# Patient Record
Sex: Female | Born: 1970 | Race: Black or African American | Hispanic: No | Marital: Single | Smoking: Never smoker
Health system: Southern US, Community
[De-identification: ages and names within clinical notes are randomized; demographics above are authoritative.]

## PROBLEM LIST (undated history)

## (undated) DIAGNOSIS — R479 Unspecified speech disturbances: Secondary | ICD-10-CM

## (undated) DIAGNOSIS — J3089 Other allergic rhinitis: Secondary | ICD-10-CM

## (undated) DIAGNOSIS — R519 Headache, unspecified: Secondary | ICD-10-CM

## (undated) DIAGNOSIS — K219 Gastro-esophageal reflux disease without esophagitis: Secondary | ICD-10-CM

## (undated) DIAGNOSIS — F32A Depression, unspecified: Secondary | ICD-10-CM

## (undated) DIAGNOSIS — F329 Major depressive disorder, single episode, unspecified: Secondary | ICD-10-CM

## (undated) DIAGNOSIS — R51 Headache: Secondary | ICD-10-CM

## (undated) DIAGNOSIS — H5711 Ocular pain, right eye: Secondary | ICD-10-CM

## (undated) DIAGNOSIS — T148XXA Other injury of unspecified body region, initial encounter: Secondary | ICD-10-CM

## (undated) DIAGNOSIS — Z9109 Other allergy status, other than to drugs and biological substances: Secondary | ICD-10-CM

## (undated) DIAGNOSIS — Z6841 Body Mass Index (BMI) 40.0 and over, adult: Secondary | ICD-10-CM

## (undated) DIAGNOSIS — F8081 Childhood onset fluency disorder: Secondary | ICD-10-CM

## (undated) DIAGNOSIS — S0990XA Unspecified injury of head, initial encounter: Secondary | ICD-10-CM

## (undated) DIAGNOSIS — M1990 Unspecified osteoarthritis, unspecified site: Secondary | ICD-10-CM

## (undated) DIAGNOSIS — D649 Anemia, unspecified: Secondary | ICD-10-CM

## (undated) DIAGNOSIS — F419 Anxiety disorder, unspecified: Secondary | ICD-10-CM

## (undated) DIAGNOSIS — G43909 Migraine, unspecified, not intractable, without status migrainosus: Secondary | ICD-10-CM

## (undated) DIAGNOSIS — E7800 Pure hypercholesterolemia, unspecified: Secondary | ICD-10-CM

## (undated) DIAGNOSIS — J45909 Unspecified asthma, uncomplicated: Secondary | ICD-10-CM

## (undated) DIAGNOSIS — Z889 Allergy status to unspecified drugs, medicaments and biological substances status: Secondary | ICD-10-CM

## (undated) HISTORY — DX: Pure hypercholesterolemia, unspecified: E78.00

## (undated) HISTORY — PX: OTHER PROCEDURE: U1053

## (undated) HISTORY — DX: Unspecified asthma, uncomplicated: J45.909

## (undated) HISTORY — DX: Gastro-esophageal reflux disease without esophagitis: K21.9

## (undated) HISTORY — DX: Anxiety disorder, unspecified: F41.9

## (undated) HISTORY — DX: Allergy status to unspecified drugs, medicaments and biological substances: Z88.9

## (undated) HISTORY — DX: Migraine, unspecified, not intractable, without status migrainosus: G43.909

## (undated) HISTORY — PX: DENTAL SURGERY: SHX609

## (undated) HISTORY — DX: Childhood onset fluency disorder: F80.81

## (undated) HISTORY — PX: TUBAL LIGATION: SHX77

## (undated) HISTORY — DX: Other allergic rhinitis: J30.89

## (undated) HISTORY — DX: Morbid (severe) obesity due to excess calories: E66.01

## (undated) HISTORY — DX: Body Mass Index (BMI) 40.0 and over, adult: Z684

## (undated) HISTORY — PX: WISDOM TOOTH EXTRACTION: SHX21

---

## 1998-04-03 ENCOUNTER — Emergency Department (HOSPITAL_COMMUNITY): Admission: EM | Admit: 1998-04-03 | Discharge: 1998-04-03 | Payer: Self-pay | Admitting: Emergency Medicine

## 1998-11-11 ENCOUNTER — Emergency Department (HOSPITAL_COMMUNITY): Admission: EM | Admit: 1998-11-11 | Discharge: 1998-11-11 | Payer: Self-pay | Admitting: Emergency Medicine

## 1998-11-11 ENCOUNTER — Encounter: Payer: Self-pay | Admitting: Emergency Medicine

## 1999-09-30 ENCOUNTER — Ambulatory Visit (HOSPITAL_COMMUNITY): Admission: RE | Admit: 1999-09-30 | Discharge: 1999-09-30 | Payer: Self-pay | Admitting: Internal Medicine

## 1999-09-30 ENCOUNTER — Encounter: Payer: Self-pay | Admitting: Internal Medicine

## 1999-12-03 ENCOUNTER — Emergency Department (HOSPITAL_COMMUNITY): Admission: EM | Admit: 1999-12-03 | Discharge: 1999-12-03 | Payer: Self-pay | Admitting: Emergency Medicine

## 2000-01-19 ENCOUNTER — Emergency Department (HOSPITAL_COMMUNITY): Admission: EM | Admit: 2000-01-19 | Discharge: 2000-01-19 | Payer: Self-pay | Admitting: *Deleted

## 2000-01-20 ENCOUNTER — Encounter: Payer: Self-pay | Admitting: Emergency Medicine

## 2000-01-20 ENCOUNTER — Emergency Department (HOSPITAL_COMMUNITY): Admission: EM | Admit: 2000-01-20 | Discharge: 2000-01-20 | Payer: Self-pay | Admitting: *Deleted

## 2000-09-14 ENCOUNTER — Emergency Department (HOSPITAL_COMMUNITY): Admission: EM | Admit: 2000-09-14 | Discharge: 2000-09-14 | Payer: Self-pay | Admitting: Emergency Medicine

## 2003-09-12 ENCOUNTER — Emergency Department (HOSPITAL_COMMUNITY): Admission: AD | Admit: 2003-09-12 | Discharge: 2003-09-12 | Payer: Self-pay | Admitting: Emergency Medicine

## 2003-09-13 ENCOUNTER — Emergency Department (HOSPITAL_COMMUNITY): Admission: EM | Admit: 2003-09-13 | Discharge: 2003-09-13 | Payer: Self-pay | Admitting: Emergency Medicine

## 2003-12-26 ENCOUNTER — Emergency Department (HOSPITAL_COMMUNITY): Admission: EM | Admit: 2003-12-26 | Discharge: 2003-12-26 | Payer: Self-pay | Admitting: Family Medicine

## 2004-01-07 ENCOUNTER — Emergency Department (HOSPITAL_COMMUNITY): Admission: EM | Admit: 2004-01-07 | Discharge: 2004-01-07 | Payer: Self-pay | Admitting: Family Medicine

## 2004-01-17 ENCOUNTER — Emergency Department (HOSPITAL_COMMUNITY): Admission: EM | Admit: 2004-01-17 | Discharge: 2004-01-17 | Payer: Self-pay | Admitting: Emergency Medicine

## 2004-06-03 ENCOUNTER — Emergency Department (HOSPITAL_COMMUNITY): Admission: EM | Admit: 2004-06-03 | Discharge: 2004-06-03 | Payer: Self-pay | Admitting: Family Medicine

## 2005-01-25 ENCOUNTER — Emergency Department (HOSPITAL_COMMUNITY): Admission: EM | Admit: 2005-01-25 | Discharge: 2005-01-25 | Payer: Self-pay | Admitting: Emergency Medicine

## 2005-03-31 ENCOUNTER — Emergency Department (HOSPITAL_COMMUNITY): Admission: EM | Admit: 2005-03-31 | Discharge: 2005-03-31 | Payer: Self-pay | Admitting: Emergency Medicine

## 2006-03-19 ENCOUNTER — Ambulatory Visit (HOSPITAL_COMMUNITY): Admission: RE | Admit: 2006-03-19 | Discharge: 2006-03-19 | Payer: Self-pay | Admitting: Family Medicine

## 2006-05-24 ENCOUNTER — Emergency Department (HOSPITAL_COMMUNITY): Admission: EM | Admit: 2006-05-24 | Discharge: 2006-05-24 | Payer: Self-pay | Admitting: Emergency Medicine

## 2007-04-15 ENCOUNTER — Emergency Department (HOSPITAL_COMMUNITY): Admission: EM | Admit: 2007-04-15 | Discharge: 2007-04-15 | Payer: Self-pay | Admitting: Emergency Medicine

## 2007-12-01 ENCOUNTER — Emergency Department (HOSPITAL_COMMUNITY): Admission: EM | Admit: 2007-12-01 | Discharge: 2007-12-01 | Payer: Self-pay | Admitting: Emergency Medicine

## 2008-03-28 ENCOUNTER — Encounter: Admission: RE | Admit: 2008-03-28 | Discharge: 2008-03-28 | Payer: Self-pay | Admitting: Internal Medicine

## 2008-04-09 ENCOUNTER — Encounter: Admission: RE | Admit: 2008-04-09 | Discharge: 2008-04-09 | Payer: Self-pay | Admitting: Internal Medicine

## 2008-11-06 ENCOUNTER — Ambulatory Visit: Payer: Self-pay | Admitting: Internal Medicine

## 2008-11-06 ENCOUNTER — Ambulatory Visit (HOSPITAL_BASED_OUTPATIENT_CLINIC_OR_DEPARTMENT_OTHER): Admission: RE | Admit: 2008-11-06 | Discharge: 2008-11-06 | Payer: Self-pay | Admitting: Family Medicine

## 2008-11-19 ENCOUNTER — Encounter: Admission: RE | Admit: 2008-11-19 | Discharge: 2008-11-19 | Payer: Self-pay | Admitting: Family Medicine

## 2008-12-02 ENCOUNTER — Emergency Department (HOSPITAL_COMMUNITY): Admission: EM | Admit: 2008-12-02 | Discharge: 2008-12-02 | Payer: Self-pay | Admitting: Family Medicine

## 2009-06-28 IMAGING — CR DG KNEE COMPLETE 4+V*R*
4 series · 4 of 4 positions shown · non-contrast
Comparison: No priors

CLINICAL DATA: Persistent right knee pain

RIGHT KNEE - COMPLETE 4+ VIEW

[view not recorded (1 of 4)]
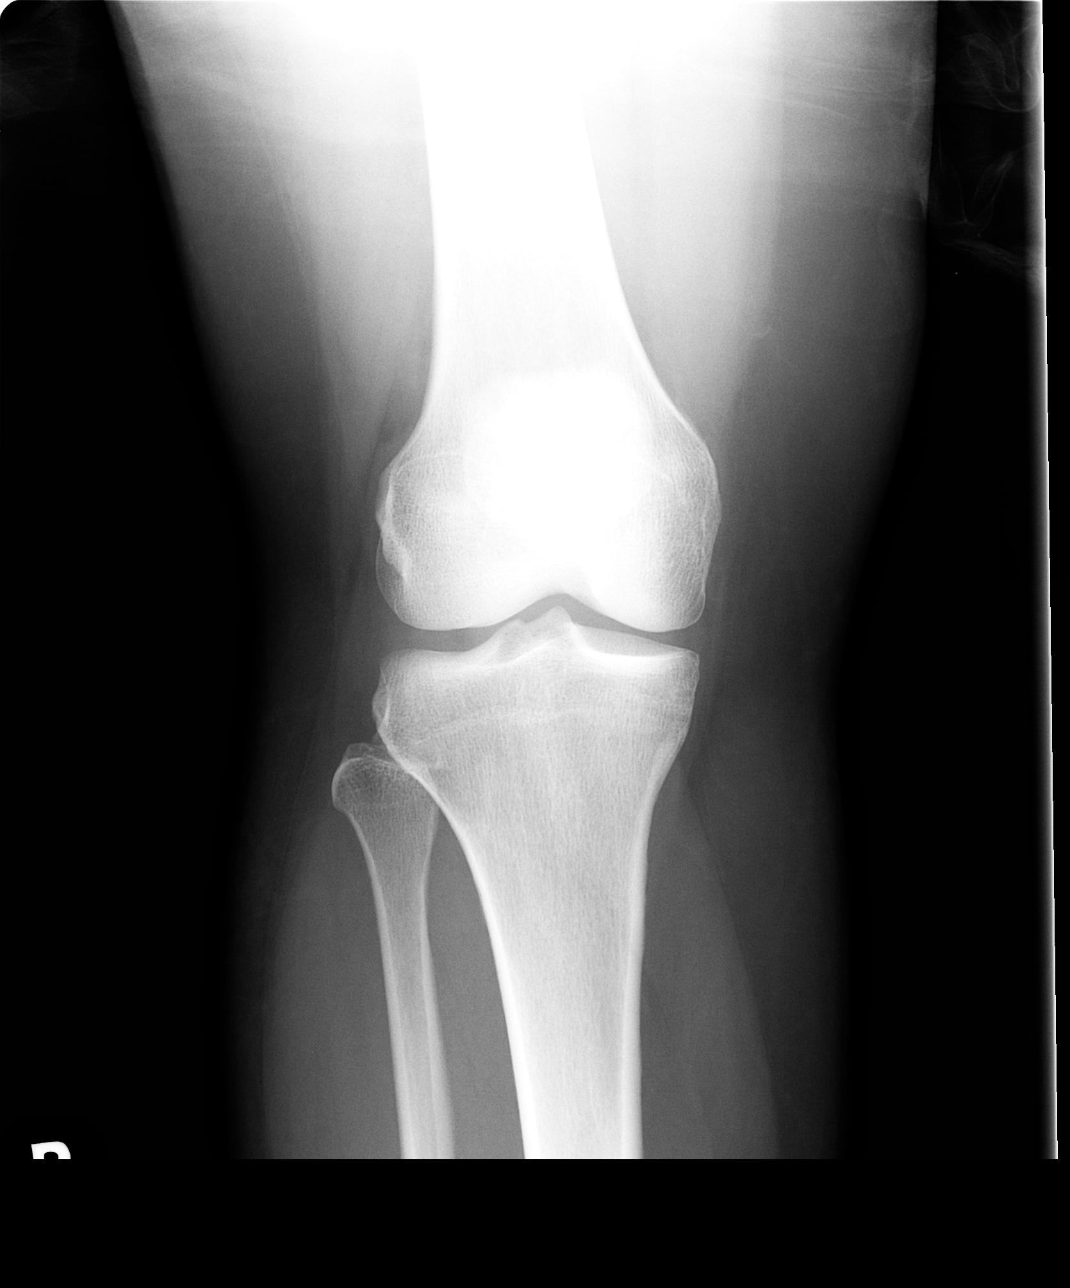

[view not recorded (2 of 4)]
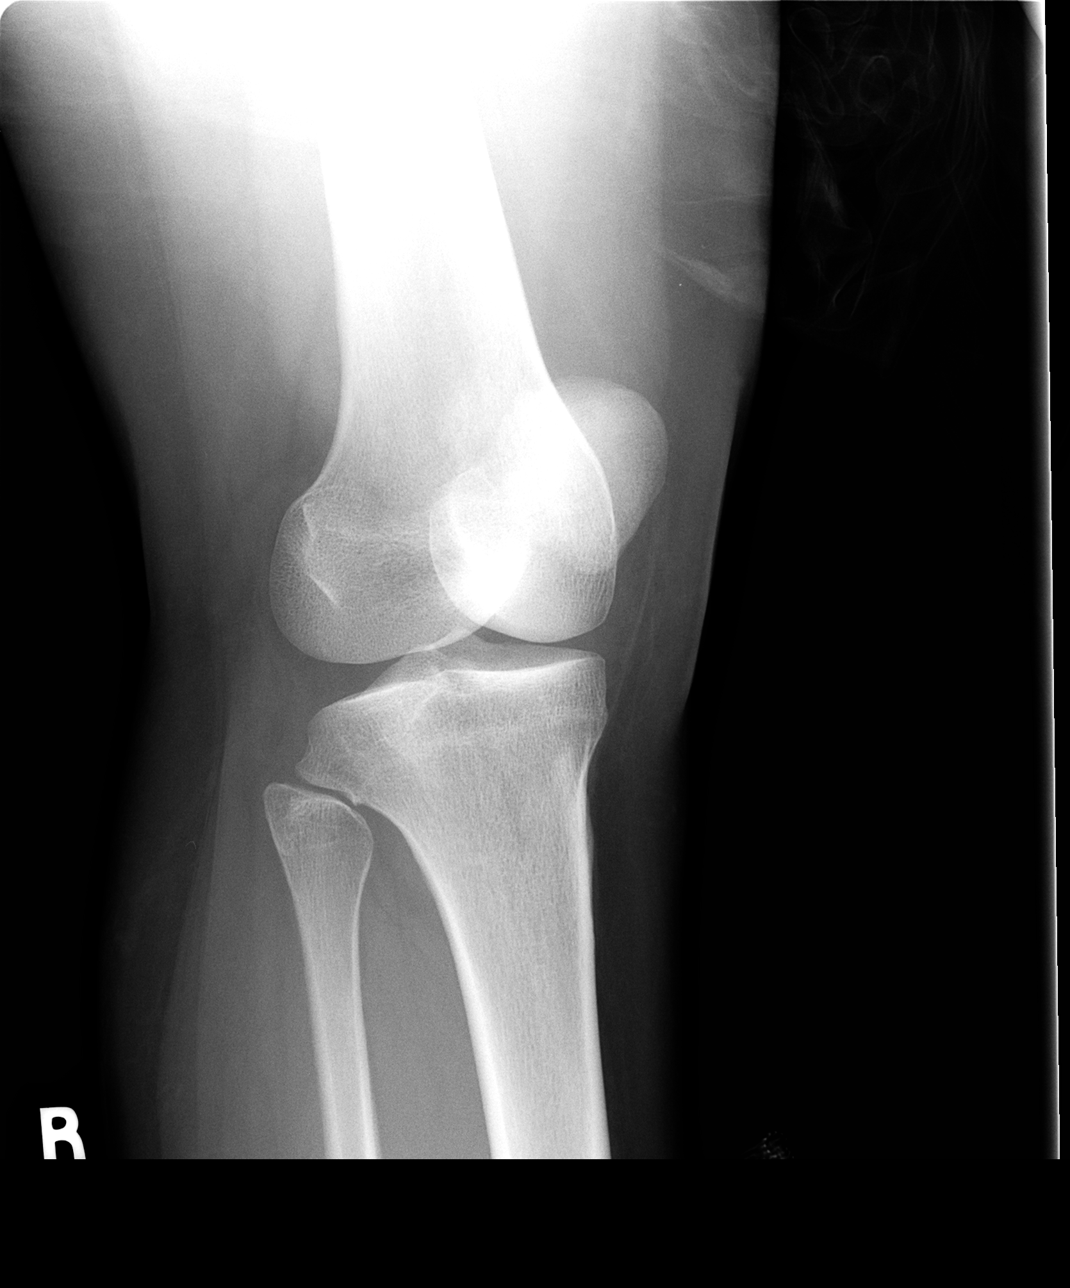

[view not recorded (3 of 4)]
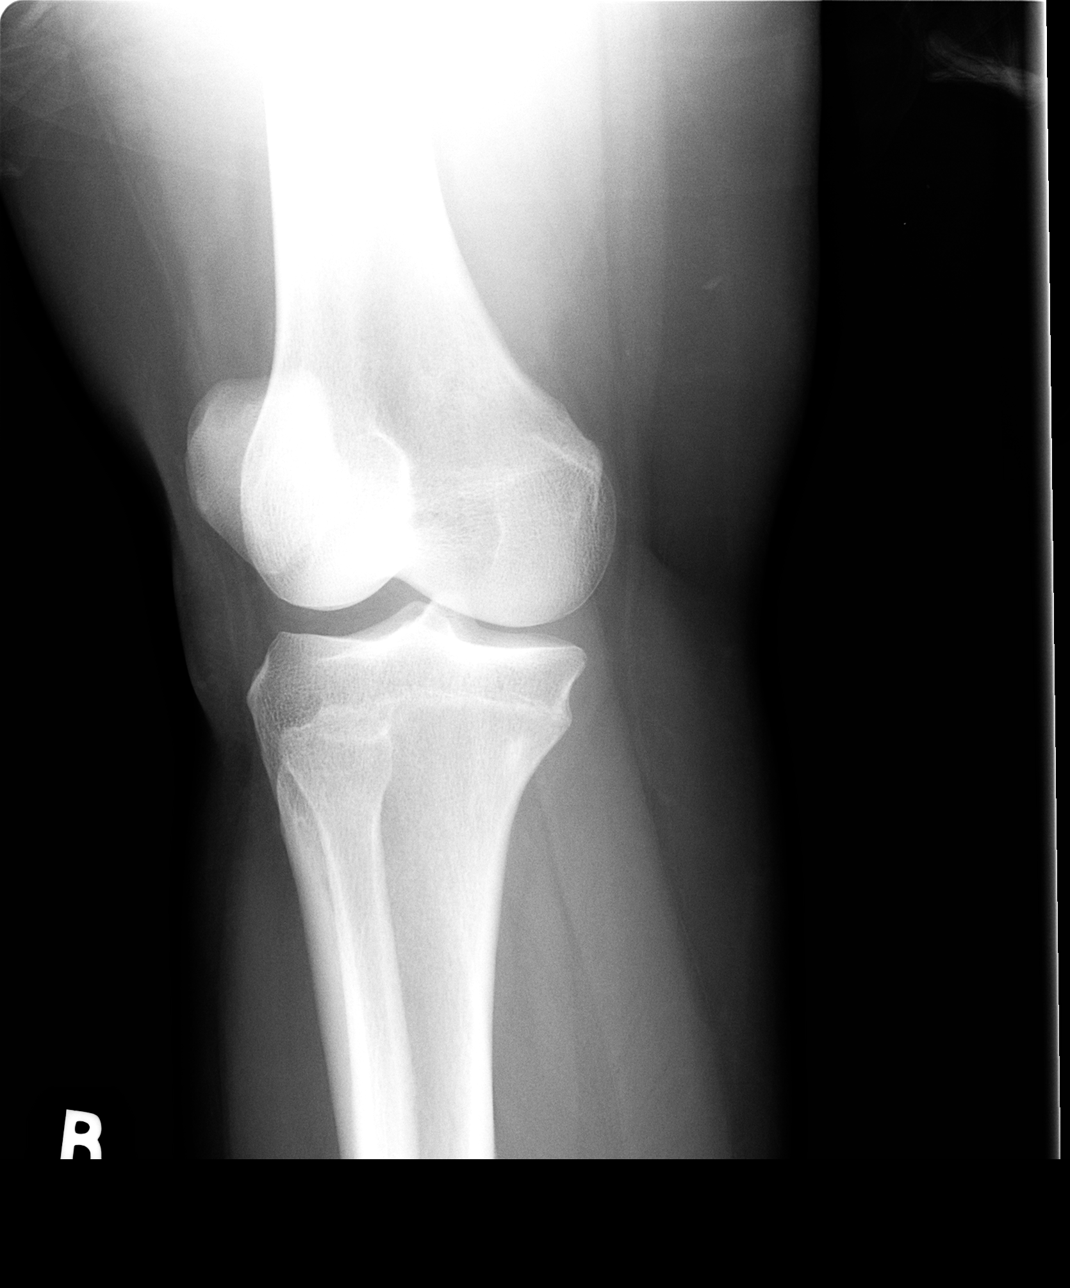

[view not recorded (4 of 4)]
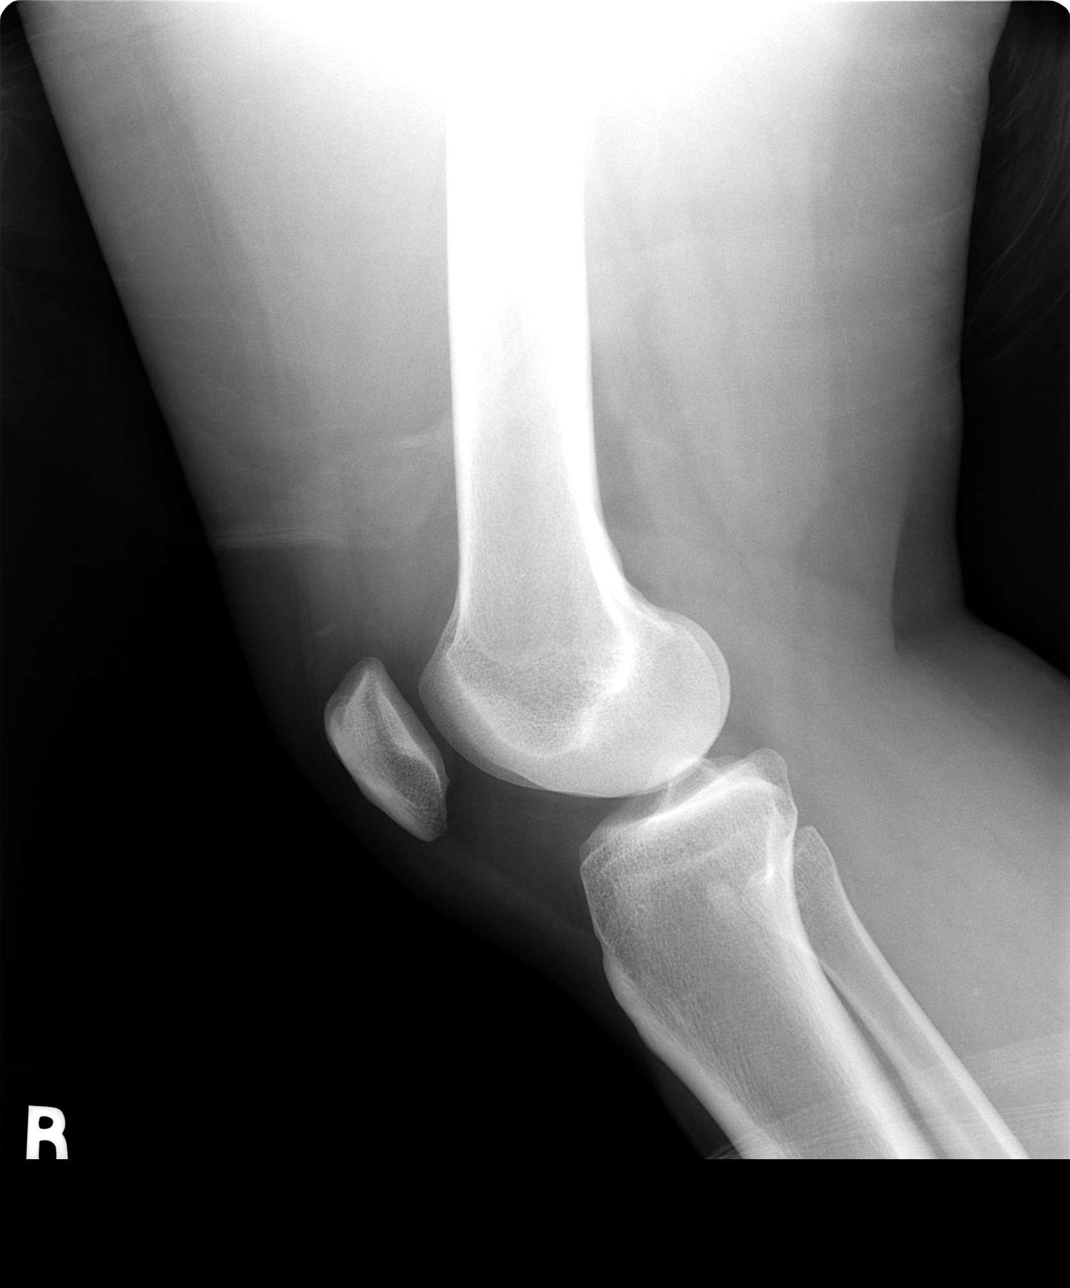

[4 of 4 positions shown; findings below may reference images not displayed]

FINDINGS: No fracture or dislocation.  Cannot rule out joint
effusion on the lateral view.  This is not a definite finding.
Needs clinical correlation.
IMPRESSION: Cannot exclude joint effusion - otherwise unremarkable.

## 2009-08-26 ENCOUNTER — Emergency Department (HOSPITAL_COMMUNITY): Admission: EM | Admit: 2009-08-26 | Discharge: 2009-08-27 | Payer: Self-pay | Admitting: Emergency Medicine

## 2010-11-17 NOTE — Procedures (Signed)
NAME:  Lynn Molina, Lynn Molina NO.:  [id number]   MEDICAL RECORD NO.:  [id number]          PATIENT TYPE:  OUT   LOCATION:  SLEEP CENTER                 FACILITY:  [facility]   PHYSICIAN:  Clinton D. Maple Hudson, MD, FCCP, FACPDATE OF BIRTH:  1972/04/30   DATE OF STUDY:                            NOCTURNAL POLYSOMNOGRAM   REFERRING PHYSICIAN:  Lorelle Formosa, M.D.   INDICATION FOR STUDY:  Hypersomnia with sleep apnea.   EPWORTH SLEEPINESS SCORE:  13/24.  BMI 33.8.  Weight 185 pounds.  Height  62 inches.  Neck 13.5 inches.   MEDICATIONS:  Home medications charted and reviewed.   SLEEP ARCHITECTURE:  Total sleep time 290 minutes with sleep efficiency  71.6%.  Stage I was 7.4%.  Stage II 78.7%.  Stage III absent.  REM 13.9%  of total sleep time.  Sleep latency 49 minutes.  REM latency 73 minutes.  Awake after sleep onset 66 minutes.  Arousal index 17.38.  No bedtime  medication was taken.   RESPIRATORY DATA:  Apnea/hypopnea index (AHI) 4.3 per hour.  A total of  21 events were scored including 6 obstructive apneas and 15 hypopneas.  Events were not positional.  REM AHI 26.7.  An additional 37 respiratory  effort related arousals were counted as events not meeting duration and  oxygen desaturation criteria to be scored as standard apneas or  hypopneas, but still recognized by the technician as causing an EEG  arousal, for a respiratory disturbance index (RDI) of 12 per hour.  There were insufficient events to permit CPAP titration by split  protocol on the study night.   OXYGEN DATA:  Moderately loud snoring with oxygen desaturation to a  nadir of 89%.  Mean oxygen saturation through the study was 95.7% on  room air.   CARDIAC DATA:  Normal sinus rhythm.   MOVEMENT-PARASOMNIA:  No significant movement disturbance.  Bathroom x1.   IMPRESSIONS-RECOMMENDATIONS:  1. Respiratory events with sleep disturbance.  The study was within      normal limits by AHI criteria, 4.3 per  hour (normal range 0-5 per      hour).  By broader RDI criteria 12 per hour, she would be      considered to have mild obstructive sleep apnea/hypopnea syndrome.      Moderately loud snoring with oxygen desaturation to a nadir of 89%.  2. There were insufficient events to permit CPAP titration by split      protocol on the study night.  Consider return for CPAP titration or      evaluate for alternative management as indicated.      Clinton D. Maple Hudson, MD, [facility], FACP  Diplomate, Biomedical engineer of Sleep Medicine  Electronically Signed     CDY/MEDQ  D:  11/11/2008 20:44:24  T:  11/12/2008 10:41:04  Job:  [id number]

## 2011-07-16 ENCOUNTER — Emergency Department (INDEPENDENT_AMBULATORY_CARE_PROVIDER_SITE_OTHER)
Admission: EM | Admit: 2011-07-16 | Discharge: 2011-07-16 | Disposition: A | Discharge status: Home / Self Care | Payer: Medicare Other | Source: Home / Self Care | Attending: Emergency Medicine | Admitting: Emergency Medicine

## 2011-07-16 ENCOUNTER — Encounter (HOSPITAL_COMMUNITY): Payer: Self-pay

## 2011-07-16 DIAGNOSIS — S90859A Superficial foreign body, unspecified foot, initial encounter: Secondary | ICD-10-CM

## 2011-07-16 DIAGNOSIS — IMO0002 Reserved for concepts with insufficient information to code with codable children: Secondary | ICD-10-CM

## 2011-07-16 HISTORY — DX: Unspecified injury of head, initial encounter: S09.90XA

## 2011-07-16 HISTORY — DX: Other injury of unspecified body region, initial encounter: T14.8XXA

## 2011-07-16 NOTE — ED Provider Notes (Signed)
History     CSN: [id number]  Arrival date & time 07/16/11  1447   First MD Initiated Contact with Patient 07/16/11 1603      Chief Complaint  Patient presents with  . Foreign Body    (Consider location/radiation/quality/duration/timing/severity/associated sxs/prior treatment) HPI Comments: Lynn Molina has a small glass shard in the anterior portion of her left foot since this morning. It hurts to walk on it or to putting pressure on the area. There is no surrounding swelling, erythema, or purulent drainage. Her tetanus immunizations are up-to-date.  Patient is a 41 y.o. female presenting with foreign body.  Foreign Body     Past Medical History  Diagnosis Date  . Asthma   . Head injury   . Nerve damage     Past Surgical History  Procedure Date  . Tubal ligation     History reviewed. No pertinent family history.  History  Substance Use Topics  . Smoking status: Never Smoker   . Smokeless tobacco: Not on file  . Alcohol Use: No    OB History    Grav Para Term Preterm Abortions TAB SAB Ect Mult Living                  Review of Systems  Musculoskeletal: Negative for myalgias, back pain, joint swelling, arthralgias and gait problem.  Skin: Negative for rash and wound.  Neurological: Negative for weakness and numbness.    Allergies  Zyrtec  Home Medications   Current Outpatient Rx  Name Route Sig Dispense Refill  . ALBUTEROL IN Inhalation Inhale into the lungs.    Marland Kitchen VICOPROFEN PO Oral Take by mouth.    . ALAVERT PO Oral Take by mouth.    Marland Kitchen LYRICA PO Oral Take by mouth.      BP 126/71  Pulse 86  Temp(Src) 99.2 F (37.3 C) (Oral)  Resp 18  SpO2 99%  LMP 06/22/2011  Physical Exam  Nursing note and vitals reviewed. Constitutional: She is oriented to person, place, and time. She appears well-developed and well-nourished. No distress.  Musculoskeletal: Normal range of motion. She exhibits no edema and no tenderness.       Exam left foot reveals a tiny  glass splinter in the anterior aspect of her foot.  Neurological: She is alert and oriented to person, place, and time. She has normal strength and normal reflexes. She displays no atrophy. No sensory deficit. She exhibits normal muscle tone.  Skin: Skin is warm and dry. No rash noted. She is not diaphoretic.    ED Course  FOREIGN BODY REMOVAL Date/Time: 07/16/2011 6:06 PM Performed by: Roque Lias Authorized by: Roque Lias Consent: Verbal consent obtained. Written consent not obtained. Risks and benefits: risks, benefits and alternatives were discussed Consent given by: patient Patient understanding: patient states understanding of the procedure being performed Patient identity confirmed: verbally with patient and arm band Body area: skin General location: lower extremity Location details: left foot Patient sedated: no Patient restrained: no Patient cooperative: yes Localization method: magnification Removal mechanism: forceps Dressing: dressing applied Tendon involvement: none Depth: subcutaneous Complexity: simple 1 objects recovered. Post-procedure assessment: foreign body removed Patient tolerance: Patient tolerated the procedure well with no immediate complications. Comments: A tiny glass she was identified more by palpation and by visualization. This was then prepped with alcohol and removed with 11 scalpel blade and a forceps. Patient experienced immediate relief of symptoms thereafter. The shard of glass was no more than 1 mm in size.   (  including critical care time)  Labs Reviewed - No data to display No results found.   1. Foreign body in foot       MDM          Roque Lias, MD 07/16/11 [phone]

## 2011-07-16 NOTE — ED Notes (Signed)
Pt states she has sliver of glass stuck in bottom of lt foot- states she stepped on something in her house around 7:30 am today.  States she removed part of it.

## 2013-05-15 ENCOUNTER — Encounter (INDEPENDENT_AMBULATORY_CARE_PROVIDER_SITE_OTHER): Payer: Self-pay

## 2013-05-15 ENCOUNTER — Encounter: Payer: Self-pay | Admitting: Diagnostic Neuroimaging

## 2013-05-15 ENCOUNTER — Ambulatory Visit (INDEPENDENT_AMBULATORY_CARE_PROVIDER_SITE_OTHER): Payer: Medicare HMO | Admitting: Diagnostic Neuroimaging

## 2013-05-15 VITALS — BP 110/78 | HR 67 | Temp 98.9°F | Ht 62.5 in | Wt 194.0 lb

## 2013-05-15 DIAGNOSIS — S0990XS Unspecified injury of head, sequela: Secondary | ICD-10-CM | POA: Insufficient documentation

## 2013-05-15 DIAGNOSIS — G44309 Post-traumatic headache, unspecified, not intractable: Secondary | ICD-10-CM | POA: Insufficient documentation

## 2013-05-15 NOTE — Progress Notes (Signed)
[facility]  PATIENT: Lynn Molina DOB: [birth year]  REFERRING CLINICIAN: A Morrow HISTORY FROM: patient REASON FOR VISIT: new consult   HISTORICAL  CHIEF COMPLAINT:  Chief Complaint  Patient presents with  . 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  5 years ago patient was volunteering at [employer], when she was struck in the head by a hammer which fell off the roof. She immediately felt dazed, wheezy and dizzy. She went to the hospital was evaluated. Patient was discharged home with pain medication.  Versus a time she's had memory problems, word finding difficulty, stuttering. She's also been through pain management.  Currently patient has pressure and intermittent stabbing pain on her right parietal and vertex region. She has some sensitivity to light, burning sensation in her eye on the right side. No nausea vomiting.   Patient's pain is well-controlled with Lyrica 150 mg daily. Previously was on hydrocodone/ibuprofen, but she is not requiring much nowadays.    REVIEW OF SYSTEMS: Full 14 system review of systems performed and notable only for fatigue eye pain shortness of breath snoring anemia allergy moles memory loss headache numbness weakness nor and depression anxiety decreased energy racing thoughts.  ALLERGIES: Allergies  Allergen Reactions  . Zyrtec [Cetirizine Hcl]     HOME MEDICATIONS: Outpatient Prescriptions Prior to Visit  Medication Sig Dispense Refill  . ALBUTEROL IN Inhale into the lungs as needed.       . Loratadine (ALAVERT PO) Take 1 tablet by mouth daily.       . Pregabalin (LYRICA PO) Take 1 tablet by mouth 3 (three) times daily. 150mg       . Hydrocodone-Ibuprofen (VICOPROFEN PO) Take by mouth. 1/2 tab tid       No facility-administered  medications prior to visit.    PAST MEDICAL HISTORY: Past Medical History  Diagnosis Date  . Asthma   . Head injury   . Nerve damage     PAST SURGICAL HISTORY: Past Surgical History  Procedure Laterality Date  . Tubal ligation      FAMILY HISTORY: Family History  Problem Relation Age of Onset  . Breast cancer Mother   . Heart Problems Father   . Leukemia Brother   . Heart Problems Daughter   . Prostate cancer Paternal Grandfather   . Cancer Brother   . Diabetes Paternal Grandmother   . Diabetes Maternal Grandmother   . High blood pressure Maternal Grandmother   . High blood pressure Paternal Grandmother     SOCIAL HISTORY:  History   Social History  . Marital Status: Single    Spouse Name: N/A    Number of Children: 2  . Years of Education: Cosmetolog   Occupational History  .  Other    n/a   Social History Main Topics  . Smoking status: Never Smoker   . Smokeless tobacco: Never Used  . Alcohol Use: Yes     Comment: twice a year  . Drug Use: No  . Sexual Activity: Not on file   Other Topics Concern  . Not on file   Social History Narrative   Patient lives at home with daughter.   Caffeine Use: once or twice weekly     PHYSICAL EXAM  Filed Vitals:   05/15/13 1055  BP:  110/78  Pulse: 67  Temp: 98.9 F (37.2 C)  TempSrc: Oral  Height: 5' 2.5" (1.588 m)  Weight: 194 lb (87.998 kg)    Not recorded    Body mass index is 34.9 kg/(m^2).  GENERAL EXAM: Patient is in no distress  CARDIOVASCULAR: Regular rate and rhythm, no murmurs, no carotid bruits  NEUROLOGIC: MENTAL STATUS: awake, alert, language fluent, comprehension intact, naming intact CRANIAL NERVE: no papilledema on fundoscopic exam, pupils equal and reactive to light, visual fields full to confrontation, extraocular muscles intact, no nystagmus, facial sensation and strength symmetric, uvula midline, shoulder shrug symmetric, tongue midline. MOTOR: normal bulk and tone, full  strength in the BUE, BLE SENSORY: normal and symmetric to light touch, temperature, vibration COORDINATION: finger-nose-finger, fine finger movements normal REFLEXES: deep tendon reflexes present and symmetric GAIT/STATION: narrow based gait  DIAGNOSTIC DATA (LABS, IMAGING, TESTING) - I reviewed patient records, labs, notes, testing and imaging myself where available.  No results found for this basename: WBC, HGB, HCT, MCV, PLT   No results found for this basename: na, k, cl, co2, glucose, bun, creatinine, calcium, prot, albumin, ast, alt, alkphos, bilitot, gfrnonaa, gfraa   No results found for this basename: CHOL, HDL, LDLCALC, LDLDIRECT, TRIG, CHOLHDL   No results found for this basename: HGBA1C   No results found for this basename: VITAMINB12   No results found for this basename: TSH      ASSESSMENT AND PLAN  42 y.o. year old female here with history of head trauma (hit by a hammer falling off a roof) in 2009, with chronic pain as a result.  Dx: post-traumatic headaches  PLAN: - agree with pain mgmt referral - consider continuing lyrica but tapering off narcotics; use NSAIDs for breakthrough pain   Return if symptoms worsen or fail to improve, for return to PCP.    Suanne Marker, MD 05/15/2013, 11:48 AM Certified in Neurology, Neurophysiology and Neuroimaging  [facility] [address] [place], [place] [address] [phone]

## 2013-05-15 NOTE — Patient Instructions (Signed)
Continue current medications. 

## 2014-01-25 ENCOUNTER — Encounter (HOSPITAL_COMMUNITY): Payer: Self-pay | Admitting: Emergency Medicine

## 2014-01-25 ENCOUNTER — Emergency Department (HOSPITAL_COMMUNITY)
Admission: EM | Admit: 2014-01-25 | Discharge: 2014-01-25 | Disposition: A | Discharge status: Home / Self Care | Payer: Medicare HMO | Attending: Emergency Medicine | Admitting: Emergency Medicine

## 2014-01-25 DIAGNOSIS — H579 Unspecified disorder of eye and adnexa: Secondary | ICD-10-CM | POA: Diagnosis not present

## 2014-01-25 DIAGNOSIS — H5710 Ocular pain, unspecified eye: Secondary | ICD-10-CM | POA: Diagnosis present

## 2014-01-25 DIAGNOSIS — Z79899 Other long term (current) drug therapy: Secondary | ICD-10-CM | POA: Insufficient documentation

## 2014-01-25 DIAGNOSIS — J45909 Unspecified asthma, uncomplicated: Secondary | ICD-10-CM | POA: Diagnosis not present

## 2014-01-25 DIAGNOSIS — H18891 Other specified disorders of cornea, right eye: Secondary | ICD-10-CM

## 2014-01-25 DIAGNOSIS — Z87828 Personal history of other (healed) physical injury and trauma: Secondary | ICD-10-CM | POA: Insufficient documentation

## 2014-01-25 MED ORDER — FLUORESCEIN SODIUM 1 MG OP STRP
1.0000 | ORAL_STRIP | Freq: Once | OPHTHALMIC | Status: DC
  Filled 2014-01-25: qty 1

## 2014-01-25 MED ORDER — TETRACAINE HCL 0.5 % OP SOLN
2.0000 [drp] | Freq: Once | OPHTHALMIC | Status: DC
  Filled 2014-01-25: qty 2

## 2014-01-25 NOTE — ED Provider Notes (Signed)
CSN: [id number]     Arrival date & time 01/25/14  0250 History   First MD Initiated Contact with Patient 01/25/14 [phone]     Chief Complaint  Patient presents with  . Eye Problem     (Consider location/radiation/quality/duration/timing/severity/associated sxs/prior Treatment) HPI  Patient presents to the ED with complaints of right eye pain. She reports dreaming about talking to a doctor about her mediations when in her dream her eye started to burn. She woke up and reports her eye was burning like if a jalapeno were in it. She reports going to the bathroom using eye drops and rinsing out her eye twice and not having significant relief, therefore she came to the ED. While waiting in the ED the burning resolved. She reports gardening and cooking with her jalapeno's but felt that she had washed her hands. While talking to her symptoms she realized that she had touched some things by her bed after touching Jalapeno's and before washing her hand. She has no history of glaucoma and says her eye is no long er red either. Denies any other associated symptoms. No associated vision changes or headache.  Past Medical History  Diagnosis Date  . Asthma   . Head injury   . Nerve damage    Past Surgical History  Procedure Laterality Date  . Tubal ligation     Family History  Problem Relation Age of Onset  . Breast cancer Mother   . Heart Problems Father   . Leukemia Brother   . Heart Problems Daughter   . Prostate cancer Paternal Grandfather   . Cancer Brother   . Diabetes Paternal Grandmother   . Diabetes Maternal Grandmother   . High blood pressure Maternal Grandmother   . High blood pressure Paternal Grandmother    History  Substance Use Topics  . Smoking status: Never Smoker   . Smokeless tobacco: Never Used  . Alcohol Use: Yes     Comment: twice a year   OB History   Grav Para Term Preterm Abortions TAB SAB Ect Mult Living                 Review of Systems   Review of Systems   Gen: no weight loss, fevers, chills, night sweats  Eyes: no discharge or drainage, visual changes  + occular pain  Nose: no epistaxis or rhinorrhea  Mouth: no dental pain, no sore throat  Neck: no neck pain  Lungs:No wheezing, coughing or hemoptysis CV: no chest pain, palpitations, dependent edema or orthopnea  Abd: no abdominal pain, nausea, vomiting, diarrhea GU: no dysuria or gross hematuria  MSK:  No muscle weakness or pain Neuro: no headache, no focal neurologic deficits  Skin: no rash or wounds Psyche: no complaints    Allergies  Zyrtec  Home Medications   Prior to Admission medications   Medication Sig Start Date End Date Taking? Authorizing Provider  albuterol (PROVENTIL HFA;VENTOLIN HFA) 108 (90 BASE) MCG/ACT inhaler Inhale 2 puffs into the lungs every 6 (six) hours as needed for wheezing or shortness of breath.   Yes Historical Provider, MD  HYDROcodone-ibuprofen (VICOPROFEN) 7.5-200 MG per tablet Take 1 tablet by mouth every 8 (eight) hours as needed for moderate pain.   Yes Historical Provider, MD  Loratadine (ALAVERT PO) Take 1 tablet by mouth daily.    Yes Historical Provider, MD  pregabalin (LYRICA) 150 MG capsule Take 150 mg by mouth 3 (three) times daily.   Yes Historical Provider, MD  retapamulin (ALTABAX)  1 % ointment Apply 1 application topically 2 (two) times daily as needed (rash).    Yes Historical Provider, MD   BP 110/75  Pulse 90  Temp(Src) 98.8 F (37.1 C) (Oral)  Resp 16  Ht 5\' 2"  (1.575 m)  Wt 197 lb (89.359 kg)  BMI 36.02 kg/m2  SpO2 99%  LMP 01/12/2014 Physical Exam  Nursing note and vitals reviewed. Constitutional: She appears well-developed and well-nourished. No distress.  HENT:  Head: Normocephalic and atraumatic.  Eyes: Conjunctivae, EOM and lids are normal. Pupils are equal, round, and reactive to light. Lids are everted and swept, no foreign bodies found.  Slit lamp exam:      The right eye shows no corneal abrasion, no foreign  body, no fluorescein uptake and no anterior chamber bulge.  Neck: Normal range of motion. Neck supple.  Cardiovascular: Normal rate and regular rhythm.   Pulmonary/Chest: Effort normal.  Abdominal: Soft.  Neurological: She is alert.  Skin: Skin is warm and dry.    ED Course  Procedures (including critical care time) Labs Review Labs Reviewed - No data to display  Imaging Review No results found.   EKG Interpretation None      MDM   Final diagnoses:  Corneal irritation of right eye    Visual Acuity Visual Acuity - Bilateral Near: 20/50 ; R Near: 20/ 100 ; L Near: 20/50  Pt is no longer having symptoms of redness or pain. No more burning. She irrigated the eye herself at home. Reports that now she remembers working with Jalapeno's throughout the day and touching something near her bed before washing her hands. I feel that this is most likely the cause of her symptoms, she may have rubbed her eye while sleeping. I discussed with her that if this reoccurs she would need to be re-evaluated.  43 y.o.Lynn Molina's evaluation in the Emergency Department is complete. It has been determined that no acute conditions requiring further emergency intervention are present at this time. The patient/guardian have been advised of the diagnosis and plan. We have discussed signs and symptoms that warrant return to the ED, such as changes or worsening in symptoms.  Vital signs are stable at discharge. Filed Vitals:   01/25/14 0555  BP: 110/75  Pulse: 90  Temp:   Resp: 16    Patient/guardian has voiced understanding and agreed to follow-up with the PCP or specialist.    Linus Mako, PA-C 01/25/14 0715

## 2014-01-25 NOTE — ED Notes (Signed)
Pt. woke up this morning with right eye redness / pain , denies injury , no loss of vision .

## 2014-01-25 NOTE — ED Provider Notes (Signed)
Medical screening examination/treatment/procedure(s) were performed by non-physician practitioner and as supervising physician I was immediately available for consultation/collaboration.   EKG Interpretation None       Varney Biles, MD 01/25/14 [phone]

## 2014-01-25 NOTE — Discharge Instructions (Signed)
Eye Drops Use eye drops as directed. It may be easier to have someone help you put the drops in your eye. If you are alone, use the following instructions to help you.  Wash your hands before putting drops in your eyes.  Read the label and look at your medication. Check for any expiration date that may appear on the bottle or tube. Changes of color may be a warning that the medication is old or ineffective. This is especially true if the medication has become brown in color. If you have questions or concerns, call your caregiver. DROPS  Tilt your head back with the affected eye uppermost. Gently pull down on your lower lid. Do not pull up on the upper lid.  Look up. Place the dropper or bottle just over the edge of the lower lid near the white portion at the bottom of the eye. The goal is to have the drop go into the little sac formed by the lower lid and the bottom of the eye itself. Do not release the drop from a height of several inches over the eye. That will only serve to startle the person receiving the medicine when it lands and forces a blink.  Steady your hand in a comfortable manner. An example would be to hold the dropper or bottle between your thumb and index (pointing) finger. Lean your index finger against the brow.  Then, slowly and gently squeeze one drop of medication into your eye.  Once the medication has been applied, place your finger between the lower eyelid and the nose, pressing firmly against the nose for 5-10 seconds. This will slow the process of the eye drop entering the small canal that normally drains tears into the nose, and therefore increases the exposure of the medicine to the eye for a few extra seconds. OINTMENTS  Look up. Place the tip of the tube just over the edge of the lower lid near the white portion at the bottom of the eye. The goal is to create a line of ointment along the inner surface of the eyelid in the little sac formed by the lower lid and the  bottom of the eye itself.  Avoid touching the tube tip to your eyeball or eyelid. This avoids contamination of the tube or the medicine in the tube.  Once a line of medicine has been created, hold the upper lid up and look down before releasing the upper lid. This will force the ointment to spread over the surface of the eye.  Your vision will be very blurry for a few minutes after applying an ointment properly. This is normal and will clear as you continue to blink. For this reason, it is best to apply ointments just before going to sleep, or at a time when you can rest your eyes for 5-10 minutes after applying the medication. GENERAL  Store your medicine in a cool, dry place after each use.  If you need a second medication, wait at least two minutes. This helps the first medication to be taken up (absorbed) by the eye.  If you have been instructed to use both an eye drop and an eye ointment, always apply the drop first and then the ointment 3-4 minutes afterward. Never put medications into the eye unless the label reads, "For Ophthalmic Use," "For Use In Eyes" or "Eye Drops." If you have questions, call your caregiver. Document Released: 09/27/2000 Document Revised: 11/05/2013 Document Reviewed: 12/03/2008 ExitCare Patient Information 2015 [employer]. This   information is not intended to replace advice given to you by your health care provider. Make sure you discuss any questions you have with your health care provider. ° °

## 2016-01-12 ENCOUNTER — Encounter (HOSPITAL_COMMUNITY): Payer: Self-pay | Admitting: *Deleted

## 2016-01-13 ENCOUNTER — Other Ambulatory Visit: Payer: Self-pay | Admitting: Obstetrics

## 2016-01-20 MED ORDER — DOXYCYCLINE HYCLATE 100 MG IV SOLR
100.0000 mg | Freq: Two times a day (BID) | INTRAVENOUS | Status: DC
  Filled 2016-01-20 (×4): qty 100

## 2016-01-20 MED ORDER — DOXYCYCLINE HYCLATE 100 MG IV SOLR
100.0000 mg | Freq: Two times a day (BID) | INTRAVENOUS | Status: AC
  Administered 2016-01-21: 100 mg via INTRAVENOUS
  Filled 2016-01-20: qty 100

## 2016-01-21 ENCOUNTER — Encounter (HOSPITAL_COMMUNITY): Payer: Self-pay | Admitting: *Deleted

## 2016-01-21 ENCOUNTER — Ambulatory Visit (HOSPITAL_COMMUNITY): Payer: Medicare HMO | Admitting: Anesthesiology

## 2016-01-21 ENCOUNTER — Ambulatory Visit (HOSPITAL_COMMUNITY)
Admission: RE | Admit: 2016-01-21 | Discharge: 2016-01-21 | Disposition: A | Discharge status: Home / Self Care | Payer: Medicare HMO | Source: Ambulatory Visit | Attending: Obstetrics | Admitting: Obstetrics

## 2016-01-21 ENCOUNTER — Encounter (HOSPITAL_COMMUNITY)
Admission: RE | Disposition: A | Discharge status: Home / Self Care | Payer: Self-pay | Source: Ambulatory Visit | Attending: Obstetrics

## 2016-01-21 DIAGNOSIS — K219 Gastro-esophageal reflux disease without esophagitis: Secondary | ICD-10-CM | POA: Insufficient documentation

## 2016-01-21 DIAGNOSIS — Z6839 Body mass index (BMI) 39.0-39.9, adult: Secondary | ICD-10-CM | POA: Diagnosis not present

## 2016-01-21 DIAGNOSIS — D250 Submucous leiomyoma of uterus: Secondary | ICD-10-CM | POA: Diagnosis not present

## 2016-01-21 DIAGNOSIS — N946 Dysmenorrhea, unspecified: Secondary | ICD-10-CM | POA: Insufficient documentation

## 2016-01-21 DIAGNOSIS — J45909 Unspecified asthma, uncomplicated: Secondary | ICD-10-CM | POA: Insufficient documentation

## 2016-01-21 DIAGNOSIS — N920 Excessive and frequent menstruation with regular cycle: Secondary | ICD-10-CM | POA: Diagnosis present

## 2016-01-21 HISTORY — DX: Anemia, unspecified: D64.9

## 2016-01-21 HISTORY — DX: Unspecified speech disturbances: R47.9

## 2016-01-21 HISTORY — DX: Headache, unspecified: R51.9

## 2016-01-21 HISTORY — DX: Gastro-esophageal reflux disease without esophagitis: K21.9

## 2016-01-21 HISTORY — PX: DILATATION & CURETTAGE/HYSTEROSCOPY WITH MYOSURE: SHX6511

## 2016-01-21 HISTORY — DX: Headache: R51

## 2016-01-21 LAB — CBC
HEMATOCRIT: 28.5 % — AB (ref 36.0–46.0)
HEMOGLOBIN: 8.2 g/dL — AB (ref 12.0–15.0)
MCH: 18.6 pg — ABNORMAL LOW (ref 26.0–34.0)
MCHC: 28.8 g/dL — AB (ref 30.0–36.0)
MCV: 64.5 fL — ABNORMAL LOW (ref 78.0–100.0)
Platelets: 433 10*3/uL — ABNORMAL HIGH (ref 150–400)
RBC: 4.42 MIL/uL (ref 3.87–5.11)
RDW: 19.1 % — AB (ref 11.5–15.5)
WBC: 6.3 10*3/uL (ref 4.0–10.5)

## 2016-01-21 LAB — BASIC METABOLIC PANEL
ANION GAP: 9 (ref 5–15)
BUN: 9 mg/dL (ref 6–20)
CALCIUM: 9.3 mg/dL (ref 8.9–10.3)
CO2: 28 mmol/L (ref 22–32)
Chloride: 102 mmol/L (ref 101–111)
Creatinine, Ser: 0.76 mg/dL (ref 0.44–1.00)
GFR calc Af Amer: >60 mL/min (ref >60)
Glucose, Bld: 86 mg/dL (ref 65–99)
POTASSIUM: 3.9 mmol/L (ref 3.5–5.1)
SODIUM: 139 mmol/L (ref 135–145)

## 2016-01-21 LAB — TYPE AND SCREEN
ABO/RH(D): A POS
ANTIBODY SCREEN: NEGATIVE

## 2016-01-21 LAB — ABO/RH: ABO/RH(D): A POS

## 2016-01-21 LAB — PREGNANCY, URINE: PREG TEST UR: NEGATIVE

## 2016-01-21 SURGERY — DILATATION & CURETTAGE/HYSTEROSCOPY WITH MYOSURE
Anesthesia: General | Site: Vagina

## 2016-01-21 MED ORDER — HYDROCODONE-ACETAMINOPHEN 5-325 MG PO TABS
ORAL_TABLET | ORAL | Status: AC
  Filled 2016-01-21: qty 1

## 2016-01-21 MED ORDER — MIDAZOLAM HCL 2 MG/2ML IJ SOLN
INTRAMUSCULAR | Status: AC
  Filled 2016-01-21: qty 2

## 2016-01-21 MED ORDER — ONDANSETRON HCL 4 MG/2ML IJ SOLN
INTRAMUSCULAR | Status: AC
Start: 2016-01-21 — End: 2016-01-21
  Filled 2016-01-21: qty 2

## 2016-01-21 MED ORDER — VASOPRESSIN 20 UNIT/ML IV SOLN
INTRAVENOUS | Status: DC | PRN
  Administered 2016-01-21: 5 [IU]

## 2016-01-21 MED ORDER — PROPOFOL 10 MG/ML IV BOLUS
INTRAVENOUS | Status: AC
  Filled 2016-01-21: qty 20

## 2016-01-21 MED ORDER — FENTANYL CITRATE (PF) 100 MCG/2ML IJ SOLN
25.0000 ug | INTRAMUSCULAR | Status: DC | PRN
  Administered 2016-01-21: 25 ug via INTRAVENOUS

## 2016-01-21 MED ORDER — BUPIVACAINE HCL 0.5 % IJ SOLN
INTRAMUSCULAR | Status: DC | PRN
  Administered 2016-01-21: 30 mL

## 2016-01-21 MED ORDER — SODIUM CHLORIDE 0.9 % IR SOLN
Status: DC | PRN
  Administered 2016-01-21 (×2): 3000 mL

## 2016-01-21 MED ORDER — FENTANYL CITRATE (PF) 100 MCG/2ML IJ SOLN
INTRAMUSCULAR | Status: DC | PRN
  Administered 2016-01-21: 100 ug via INTRAVENOUS
  Administered 2016-01-21 (×3): 50 ug via INTRAVENOUS

## 2016-01-21 MED ORDER — PROPOFOL 10 MG/ML IV BOLUS
INTRAVENOUS | Status: DC | PRN
  Administered 2016-01-21: 200 mg via INTRAVENOUS

## 2016-01-21 MED ORDER — SCOPOLAMINE 1 MG/3DAYS TD PT72
1.0000 | MEDICATED_PATCH | Freq: Once | TRANSDERMAL | Status: DC
  Administered 2016-01-21: 1.5 mg via TRANSDERMAL

## 2016-01-21 MED ORDER — HYDROCODONE-ACETAMINOPHEN 5-325 MG PO TABS
1.0000 | ORAL_TABLET | Freq: Once | ORAL | Status: AC
  Administered 2016-01-21: 1 via ORAL

## 2016-01-21 MED ORDER — PHENYLEPHRINE HCL 10 MG/ML IJ SOLN
INTRAMUSCULAR | Status: DC | PRN
  Administered 2016-01-21: 80 ug via INTRAVENOUS
  Administered 2016-01-21: 40 ug via INTRAVENOUS
  Administered 2016-01-21: 80 ug via INTRAVENOUS

## 2016-01-21 MED ORDER — KETOROLAC TROMETHAMINE 30 MG/ML IJ SOLN
INTRAMUSCULAR | Status: AC
  Filled 2016-01-21: qty 1

## 2016-01-21 MED ORDER — HYDROCODONE-ACETAMINOPHEN 5-325 MG PO TABS: 1.0000 | ORAL_TABLET | Freq: Four times a day (QID) | ORAL | Status: DC | PRN

## 2016-01-21 MED ORDER — LIDOCAINE HCL (CARDIAC) 20 MG/ML IV SOLN
INTRAVENOUS | Status: DC | PRN
  Administered 2016-01-21: 60 mg via INTRAVENOUS

## 2016-01-21 MED ORDER — SCOPOLAMINE 1 MG/3DAYS TD PT72
MEDICATED_PATCH | TRANSDERMAL | Status: AC
  Administered 2016-01-21: 1.5 mg via TRANSDERMAL
  Filled 2016-01-21: qty 1

## 2016-01-21 MED ORDER — FENTANYL CITRATE (PF) 250 MCG/5ML IJ SOLN
INTRAMUSCULAR | Status: AC
  Filled 2016-01-21: qty 5

## 2016-01-21 MED ORDER — ONDANSETRON HCL 4 MG/2ML IJ SOLN
INTRAMUSCULAR | Status: DC | PRN
  Administered 2016-01-21: 4 mg via INTRAVENOUS

## 2016-01-21 MED ORDER — KETOROLAC TROMETHAMINE 30 MG/ML IJ SOLN
INTRAMUSCULAR | Status: DC | PRN
Start: 2016-01-21 — End: 2016-01-21
  Administered 2016-01-21: 30 mg via INTRAVENOUS

## 2016-01-21 MED ORDER — FENTANYL CITRATE (PF) 100 MCG/2ML IJ SOLN
INTRAMUSCULAR | Status: AC
  Filled 2016-01-21: qty 2

## 2016-01-21 MED ORDER — LIDOCAINE HCL (CARDIAC) 20 MG/ML IV SOLN
INTRAVENOUS | Status: AC
  Filled 2016-01-21: qty 5

## 2016-01-21 MED ORDER — KETOROLAC TROMETHAMINE 30 MG/ML IJ SOLN: 30.0000 mg | Freq: Once | INTRAMUSCULAR | Status: DC

## 2016-01-21 MED ORDER — ONDANSETRON HCL 4 MG/2ML IJ SOLN: 4.0000 mg | Freq: Once | INTRAMUSCULAR | Status: DC | PRN

## 2016-01-21 MED ORDER — MEPERIDINE HCL 25 MG/ML IJ SOLN: 6.2500 mg | INTRAMUSCULAR | Status: DC | PRN

## 2016-01-21 MED ORDER — LACTATED RINGERS IV SOLN
INTRAVENOUS | Status: DC
  Administered 2016-01-21 (×2): via INTRAVENOUS

## 2016-01-21 MED ORDER — MIDAZOLAM HCL 2 MG/2ML IJ SOLN
INTRAMUSCULAR | Status: DC | PRN
  Administered 2016-01-21 (×2): 2 mg via INTRAVENOUS

## 2016-01-21 SURGICAL SUPPLY — 19 items
CATH ROBINSON RED A/P 16FR (CATHETERS) ×3 IMPLANT
CLOTH BEACON ORANGE TIMEOUT ST (SAFETY) ×3 IMPLANT
CONTAINER PREFILL 10% NBF 60ML (FORM) ×3 IMPLANT
DEVICE MYOSURE LITE (MISCELLANEOUS) IMPLANT
DEVICE MYOSURE REACH (MISCELLANEOUS) ×3 IMPLANT
FILTER ARTHROSCOPY CONVERTOR (FILTER) ×3 IMPLANT
GLOVE BIO SURGEON STRL SZ 6.5 (GLOVE) ×4 IMPLANT
GLOVE BIO SURGEONS STRL SZ 6.5 (GLOVE) ×2
GLOVE BIOGEL PI IND STRL 7.0 (GLOVE) ×2 IMPLANT
GLOVE BIOGEL PI INDICATOR 7.0 (GLOVE) ×4
GOWN STRL REUS W/TWL LRG LVL3 (GOWN DISPOSABLE) ×6 IMPLANT
PACK VAGINAL MINOR WOMEN LF (CUSTOM PROCEDURE TRAY) ×3 IMPLANT
PAD OB MATERNITY 4.3X12.25 (PERSONAL CARE ITEMS) ×3 IMPLANT
SEAL ROD LENS SCOPE MYOSURE (ABLATOR) ×3 IMPLANT
STENT BALLN UTERINE 4CM 6FR (STENTS) IMPLANT
TOWEL OR 17X24 6PK STRL BLUE (TOWEL DISPOSABLE) ×6 IMPLANT
TUBING AQUILEX INFLOW (TUBING) ×3 IMPLANT
TUBING AQUILEX OUTFLOW (TUBING) ×3 IMPLANT
WATER STERILE IRR 1000ML POUR (IV SOLUTION) ×3 IMPLANT

## 2016-01-21 NOTE — Anesthesia Procedure Notes (Signed)
Procedure Name: LMA Insertion Date/Time: 01/21/2016 2:51 PM Performed by: Jonna Munro Pre-anesthesia Checklist: Patient identified, Emergency Drugs available, Suction available, Patient being monitored and Timeout performed Patient Re-evaluated:Patient Re-evaluated prior to inductionOxygen Delivery Method: Circle system utilized Preoxygenation: Pre-oxygenation with 100% oxygen Intubation Type: IV induction LMA: LMA inserted LMA Size: 4.0 Number of attempts: 1 Placement Confirmation: positive ETCO2 and breath sounds checked- equal and bilateral Dental Injury: Teeth and Oropharynx as per pre-operative assessment

## 2016-01-21 NOTE — Anesthesia Preprocedure Evaluation (Signed)
Anesthesia Evaluation  Patient identified by MRN, date of birth, ID band Patient awake    Reviewed: Allergy & Precautions, H&P , NPO status , Patient's Chart, lab work & pertinent test results  Airway Mallampati: I  TM Distance: >3 FB Neck ROM: full    Dental no notable dental hx. (+) Teeth Intact   Pulmonary    Pulmonary exam normal        Cardiovascular negative cardio ROS Normal cardiovascular exam     Neuro/Psych negative psych ROS   GI/Hepatic Neg liver ROS,   Endo/Other  Morbid obesity  Renal/GU negative Renal ROS     Musculoskeletal   Abdominal (+) + obese,   Peds  Hematology   Anesthesia Other Findings   Reproductive/Obstetrics negative OB ROS                             Anesthesia Physical Anesthesia Plan  ASA: III  Anesthesia Plan: General   Post-op Pain Management:    Induction: Intravenous  Airway Management Planned: LMA  Additional Equipment:   Intra-op Plan:   Post-operative Plan:   Informed Consent: I have reviewed the patients History and Physical, chart, labs and discussed the procedure including the risks, benefits and alternatives for the proposed anesthesia with the patient or authorized representative who has indicated his/her understanding and acceptance.     Plan Discussed with: CRNA and Surgeon  Anesthesia Plan Comments:         Anesthesia Quick Evaluation

## 2016-01-21 NOTE — H&P (Signed)
CC; menorrhagia, polyp vs fibroid  HPI: 45 yo NP pt with anemia due to menorrhagia and intracavitary mass- unclear whether polyp or fibroid for hysteroscopic resection.  Past Medical History  Diagnosis Date  . Head injury     Hammer fell on head - tx w lyrica  . Nerve damage     Head  . Asthma     rarely uses inhaler  . 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 GERD (gastroesophageal reflux disease)     occasional but diet controlled, no med  . Headache     tx with lyrica  . Anemia     hx  . SVD (spontaneous vaginal delivery)     x 2    Past Surgical History  Procedure Laterality Date  . Tubal ligation    . Dental surgery      tooth ext with general anesthesia  . Wisdom tooth extraction      PE: Filed Vitals:   01/12/16 1533 01/21/16 1340  BP:  135/77  Pulse:  80  Temp:  98.5 F (36.9 C)  TempSrc:  Oral  Resp:  18  Height: 5\' 2"  (1.575 m)   Weight: 96.616 kg (213 lb)   SpO2:  100%   Gen: well appearing, no distress Abd: obese, NT Gu: def to OR LE: SCDs in place  CBC    Component Value Date/Time   WBC 6.3 01/21/2016 1328   RBC 4.42 01/21/2016 1328   HGB 8.2* 01/21/2016 1328   HCT 28.5* 01/21/2016 1328   PLT 433* 01/21/2016 1328   MCV 64.5* 01/21/2016 1328   MCH 18.6* 01/21/2016 1328   MCHC 28.8* 01/21/2016 1328   RDW 19.1* 01/21/2016 1328     A/P: polyp vs fibroid, for resection.  R/B reviewed with pt, pt gives consent for blood if needed.  Alphons Burgert,Tricia A. 01/21/2016 2:29 PM

## 2016-01-21 NOTE — Op Note (Signed)
01/21/2016  3:49 PM  PATIENT:  Lynn Molina  45 y.o. female  PRE-OPERATIVE DIAGNOSIS:  Menorrhagia, Endometrial Polyp, Uterine Fibroids  POST-OPERATIVE DIAGNOSIS:  Menorrhagia, uterine fibroids  PROCEDURE:  Procedure(s) with comments: DILATATION & CURETTAGE/HYSTEROSCOPY WITH MYOSURE (N/Molina) - HOLD MYOSURE ITEMS .Marland Kitchen..POSSIBLE MYOSURE   SURGEON:  Surgeon(s) and Role:    * Aloha Gell, MD - Primary  PHYSICIAN ASSISTANT:   ASSISTANTS: none   ANESTHESIA:   local and general  EBL:  Total I/O In: -  Out: 40 [Urine:30; Blood:10]  BLOOD ADMINISTERED:none  DRAINS: none   LOCAL MEDICATIONS USED:  MARCAINE    and OTHER with 0.3cc vasopressin/ 30 cc 0.5% marcaine, total 20cc used  SPECIMEN:  Source of Specimen:  uterine fibroid  DISPOSITION OF SPECIMEN:  PATHOLOGY  COUNTS:  YES  TOURNIQUET:  * No tourniquets in log *  DICTATION: note in epic  PLAN OF CARE: Discharge to home after PACU  PATIENT DISPOSITION:  PACU - hemodynamically stable.   Delay start of Pharmacological VTE agent (>24hrs) due to surgical blood loss or risk of bleeding: yes   Abx: 100mg  IV doxycycline  Findings:2x2 cm cavitary SM fibroid stemming from L lower segment. Small 72mm fundal cavitary calcified fibroid.   visualization of bilateral ostia- only able to see L ostia after fibroid resection, hemostasis post-procedure  Indications: submucosal fibroid, bleeding, dymenorrhea anemia   After informed consent including discussion of risks of bleeding, infection, perforation,  the patient was taken to the operating room where general anesthesia was initiated without difficulty. She was prepped and draped in normal sterile fashion in the dorsal supine lithotomy position.  Molina bimanual examination was done to assess the size and position of the uterus. Molina speculum was placed in the vagina and single tooth tenaculum used to grasp the anterior lip of the cervix. Local anaesthetic with vasopressin was injected at 5  and 7 o'clock in there cervico-paracervical junction.   The cervix was then serially dilated to Molina #25 Pratt dilator. The obturator was placed into the cervix and past the internal os. The obturator was removed and the [facility] hysteroscopic blade was inserted. Survey of the endometrium/ pathology with findings as above. The myosure blade was then placed through the operating channel, suction was applied and the fibroid was serially grasped with the blade and morcellated. This continued until the majority of the fibroid was removed. The remainder of the myometrium was thin with no masses or thicker tissue. The remainder of the uterus appeared normal. Hemostasis was noted.  The hysteroscope was then removed. Tenaculum was removed. The tenaculum site was hemostatic and the case was terminated. The patient tolerated the procedure well. Sponge, lap and needle counts were correct and the patient was taken to the recovery room in stable condition.   toradol was given  Lynn Molina,Lynn Molina. 01/21/2016 3:53 PM

## 2016-01-21 NOTE — Transfer of Care (Signed)
Immediate Anesthesia Transfer of Care Note  Patient: DESMOND SCHUKNECHT  Procedure(s) Performed: Procedure(s) with comments: DILATATION & CURETTAGE/HYSTEROSCOPY WITH MYOSURE (N/A) - HOLD Bowler .Marland Kitchen..POSSIBLE MYOSURE   Patient Location: PACU  Anesthesia Type:General  Level of Consciousness: awake, alert  and oriented  Airway & Oxygen Therapy: Patient Spontanous Breathing and Patient connected to nasal cannula oxygen  Post-op Assessment: Report given to RN and Post -op Vital signs reviewed and stable  Post vital signs: Reviewed and stable  Last Vitals:  Filed Vitals:   01/21/16 1340  BP: 135/77  Pulse: 80  Temp: 36.9 C  Resp: 18    Last Pain: There were no vitals filed for this visit.    Patients Stated Pain Goal: 3 ([id number])  Complications: No apparent anesthesia complications

## 2016-01-21 NOTE — Brief Op Note (Signed)
01/21/2016  3:49 PM  PATIENT:  Lynn Molina  45 y.o. female  PRE-OPERATIVE DIAGNOSIS:  Menorrhagia, Endometrial Polyp, Uterine Fibroids  POST-OPERATIVE DIAGNOSIS:  Menorrhagia, uterine fibroids  PROCEDURE:  Procedure(s) with comments: DILATATION & CURETTAGE/HYSTEROSCOPY WITH MYOSURE (N/A) - HOLD MYOSURE ITEMS .Marland Kitchen..POSSIBLE MYOSURE   SURGEON:  Surgeon(s) and Role:    * Aloha Gell, MD - Primary  PHYSICIAN ASSISTANT:   ASSISTANTS: none   ANESTHESIA:   local and general  EBL:  Total I/O In: -  Out: 40 [Urine:30; Blood:10]  BLOOD ADMINISTERED:none  DRAINS: none   LOCAL MEDICATIONS USED:  MARCAINE    and OTHER with 0.3cc vasopressin/ 30 cc 0.5% marcaine, total 20cc used  SPECIMEN:  Source of Specimen:  uterine fibroid  DISPOSITION OF SPECIMEN:  PATHOLOGY  COUNTS:  YES  TOURNIQUET:  * No tourniquets in log *  DICTATION: note in epic  PLAN OF CARE: Discharge to home after PACU  PATIENT DISPOSITION:  PACU - hemodynamically stable.   Delay start of Pharmacological VTE agent (>24hrs) due to surgical blood loss or risk of bleeding: yes

## 2016-01-21 NOTE — Discharge Instructions (Signed)
DISCHARGE INSTRUCTIONS: HYSTEROSCOPY The following instructions have been prepared to help you care for yourself upon your return home.  May Remove Scop patch on or before 01/24/16. Wash hands with soap and water after contact with the patch.  May take Ibuprofen after 9:30pm 01/21/16.  May take stool softner while taking narcotic pain medication to prevent constipation.  Drink plenty of water.  Personal hygiene:  Use sanitary pads for vaginal drainage, not tampons.  Shower the day after your procedure.  NO tub baths, pools or Jacuzzis for 2-3 weeks.  Wipe front to back after using the bathroom.  Activity and limitations:  Do NOT drive or operate any equipment for 24 hours. The effects of anesthesia are still present and drowsiness may result.  Do NOT rest in bed all day.  Walking is encouraged.  Walk up and down stairs slowly.  You may resume your normal activity in one to two days or as indicated by your physician. Sexual activity: NO intercourse for at least 2 weeks after the procedure, or as indicated by your Doctor.  Diet: Eat a light meal as desired this evening. You may resume your usual diet tomorrow.  Return to Work: You may resume your work activities in one to two days or as indicated by Marine scientist.  What to expect after your surgery: Expect to have vaginal bleeding/discharge for 2-3 days and spotting for up to 10 days. It is not unusual to have soreness for up to 1-2 weeks. You may have a slight burning sensation when you urinate for the first day. Mild cramps may continue for a couple of days. You may have a regular period in 2-6 weeks.  Call your doctor for any of the following:  Excessive vaginal bleeding or clotting, saturating and changing one pad every hour.  Inability to urinate 6 hours after discharge from hospital.  Pain not relieved by pain medication.  Fever of 100.4 F or greater.  Unusual vaginal discharge or odor.  Return to office  _________________Call for an appointment ___________________ Patients signature: ______________________ Nurses signature ________________________  [place] [facility] [phone]

## 2016-01-21 NOTE — Anesthesia Postprocedure Evaluation (Signed)
Anesthesia Post Note  Patient: Lynn Molina  Procedure(s) Performed: Procedure(s) (LRB): DILATATION & CURETTAGE/HYSTEROSCOPY WITH MYOSURE (N/A)  Patient location during evaluation: PACU Anesthesia Type: General Level of consciousness: awake Pain management: pain level controlled Vital Signs Assessment: post-procedure vital signs reviewed and stable Respiratory status: spontaneous breathing Cardiovascular status: stable Postop Assessment: no signs of nausea or vomiting Anesthetic complications: no     Last Vitals:  Filed Vitals:   01/21/16 1630 01/21/16 1645  BP: 125/68 119/84  Pulse: 83 89  Temp:  36.3 C  Resp: 20 18    Last Pain:  Filed Vitals:   01/21/16 1649  PainSc: 2    Pain Goal: Patients Stated Pain Goal: 3 (01/21/16 1645)               [place]

## 2016-01-26 ENCOUNTER — Encounter (HOSPITAL_COMMUNITY): Payer: Self-pay | Admitting: Obstetrics

## 2016-02-14 ENCOUNTER — Inpatient Hospital Stay (HOSPITAL_COMMUNITY)
Admission: AD | Admit: 2016-02-14 | Discharge: 2016-02-14 | Disposition: A | Discharge status: Home / Self Care | Payer: Medicare HMO | Source: Ambulatory Visit | Attending: Obstetrics & Gynecology | Admitting: Obstetrics & Gynecology

## 2016-02-14 ENCOUNTER — Encounter (HOSPITAL_COMMUNITY): Payer: Self-pay | Admitting: *Deleted

## 2016-02-14 DIAGNOSIS — N946 Dysmenorrhea, unspecified: Secondary | ICD-10-CM | POA: Diagnosis present

## 2016-02-14 LAB — URINE MICROSCOPIC-ADD ON

## 2016-02-14 LAB — URINALYSIS, ROUTINE W REFLEX MICROSCOPIC
Bilirubin Urine: NEGATIVE
GLUCOSE, UA: NEGATIVE mg/dL
Ketones, ur: NEGATIVE mg/dL
LEUKOCYTES UA: NEGATIVE
Nitrite: NEGATIVE
PROTEIN: NEGATIVE mg/dL
SPECIFIC GRAVITY, URINE: 1.025 (ref 1.005–1.030)
pH: 6 (ref 5.0–8.0)

## 2016-02-14 LAB — CBC
HEMATOCRIT: 28.8 % — AB (ref 36.0–46.0)
HEMOGLOBIN: 8.4 g/dL — AB (ref 12.0–15.0)
MCH: 18.8 pg — ABNORMAL LOW (ref 26.0–34.0)
MCHC: 29.2 g/dL — ABNORMAL LOW (ref 30.0–36.0)
MCV: 64.3 fL — ABNORMAL LOW (ref 78.0–100.0)
Platelets: 423 10*3/uL — ABNORMAL HIGH (ref 150–400)
RBC: 4.48 MIL/uL (ref 3.87–5.11)
RDW: 18.6 % — ABNORMAL HIGH (ref 11.5–15.5)
WBC: 6.1 10*3/uL (ref 4.0–10.5)

## 2016-02-14 LAB — POCT PREGNANCY, URINE: PREG TEST UR: NEGATIVE

## 2016-02-14 MED ORDER — NORGESTIMATE-ETH ESTRADIOL 0.25-35 MG-MCG PO TABS: ORAL_TABLET | ORAL | 0 refills | Status: DC

## 2016-02-14 MED ORDER — HYDROCODONE-ACETAMINOPHEN 5-325 MG PO TABS
2.0000 | ORAL_TABLET | Freq: Four times a day (QID) | ORAL | 0 refills | Status: DC | PRN
Start: 2016-02-14 — End: 2017-09-07

## 2016-02-14 MED ORDER — KETOROLAC TROMETHAMINE 60 MG/2ML IM SOLN
60.0000 mg | Freq: Once | INTRAMUSCULAR | Status: AC
  Administered 2016-02-14: 60 mg via INTRAMUSCULAR
  Filled 2016-02-14: qty 2

## 2016-02-14 NOTE — MAU Note (Signed)
Patient had surgery 7/19.  She started feeling better a few weeks after surgery, but the pain started back yesterday 02/13/16 when her menstrual cycle started.  Pt c/o abdominal pain, thigh pain, and back pain.

## 2016-02-14 NOTE — Discharge Instructions (Signed)
Dysmenorrhea °Menstrual cramps (dysmenorrhea) are caused by the muscles of the uterus tightening (contracting) during a menstrual period. For some women, this discomfort is merely bothersome. For others, dysmenorrhea can be severe enough to interfere with everyday activities for a few days each month. °Primary dysmenorrhea is menstrual cramps that last a couple of days when you start having menstrual periods or soon after. This often begins after a teenager starts having her period. As a woman gets older or has a baby, the cramps will usually lessen or disappear. Secondary dysmenorrhea begins later in life, lasts longer, and the pain may be stronger than primary dysmenorrhea. The pain may start before the period and last a few days after the period.  °CAUSES  °Dysmenorrhea is usually caused by an underlying problem, such as: °· The tissue lining the uterus grows outside of the uterus in other areas of the body (endometriosis). °· The endometrial tissue, which normally lines the uterus, is found in or grows into the muscular walls of the uterus (adenomyosis). °· The pelvic blood vessels are engorged with blood just before the menstrual period (pelvic congestive syndrome). °· Overgrowth of cells (polyps) in the lining of the uterus or cervix. °· Falling down of the uterus (prolapse) because of loose or stretched ligaments. °· Depression. °· Bladder problems, infection, or inflammation. °· Problems with the intestine, a tumor, or irritable bowel syndrome. °· Cancer of the female organs or bladder. °· A severely tipped uterus. °· A very tight opening or closed cervix. °· Noncancerous tumors of the uterus (fibroids). °· Pelvic inflammatory disease (PID). °· Pelvic scarring (adhesions) from a previous surgery. °· Ovarian cyst. °· An intrauterine device (IUD) used for birth control. °RISK FACTORS °You may be at greater risk of dysmenorrhea if: °· You are younger than age 30. °· You started puberty early. °· You have  irregular or heavy bleeding. °· You have never given birth. °· You have a family history of this problem. °· You are a smoker. °SIGNS AND SYMPTOMS  °· Cramping or throbbing pain in your lower abdomen. °· Headaches. °· Lower back pain. °· Nausea or vomiting. °· Diarrhea. °· Sweating or dizziness. °· Loose stools. °DIAGNOSIS  °A diagnosis is based on your history, symptoms, physical exam, diagnostic tests, or procedures. Diagnostic tests or procedures may include: °· Blood tests. °· Ultrasonography. °· An examination of the lining of the uterus (dilation and curettage, D&C). °· An examination inside your abdomen or pelvis with a scope (laparoscopy). °· X-rays. °· CT scan. °· MRI. °· An examination inside the bladder with a scope (cystoscopy). °· An examination inside the intestine or stomach with a scope (colonoscopy, gastroscopy). °TREATMENT  °Treatment depends on the cause of the dysmenorrhea. Treatment may include: °· Pain medicine prescribed by your health care provider. °· Birth control pills or an IUD with progesterone hormone in it. °· Hormone replacement therapy. °· Nonsteroidal anti-inflammatory drugs (NSAIDs). These may help stop the production of prostaglandins. °· Surgery to remove adhesions, endometriosis, ovarian cyst, or fibroids. °· Removal of the uterus (hysterectomy). °· Progesterone shots to stop the menstrual period. °· Cutting the nerves on the sacrum that go to the female organs (presacral neurectomy). °· Electric current to the sacral nerves (sacral nerve stimulation). °· Antidepressant medicine. °· Psychiatric therapy, counseling, or group therapy. °· Exercise and physical therapy. °· Meditation and yoga therapy. °· Acupuncture. °HOME CARE INSTRUCTIONS  °· Only take over-the-counter or prescription medicines as directed by your health care provider. °· Place a heating pad   or hot water bottle on your lower back or abdomen. Do not sleep with the heating pad.  Use aerobic exercises, walking,  swimming, biking, and other exercises to help lessen the cramping.  Massage to the lower back or abdomen may help.  Stop smoking.  Avoid alcohol and caffeine. SEEK MEDICAL CARE IF:   Your pain does not get better with medicine.  You have pain with sexual intercourse.  Your pain increases and is not controlled with medicines.  You have abnormal vaginal bleeding with your period.  You develop nausea or vomiting with your period that is not controlled with medicine. SEEK IMMEDIATE MEDICAL CARE IF:  You pass out.    This information is not intended to replace advice given to you by your health care provider. Make sure you discuss any questions you have with your health care provider.   Document Released: 06/21/2005 Document Revised: 02/21/2013 Document Reviewed: 12/07/2012 Elsevier Interactive Patient Education 2016 [employer]  Uterine Fibroids Uterine fibroids are tissue masses (tumors) that can develop in the womb (uterus). They are also called leiomyomas. This type of tumor is not cancerous (benign) and does not spread to other parts of the body outside of the pelvic area, which is between the hip bones. Occasionally, fibroids may develop in the fallopian tubes, in the cervix, or on the support structures (ligaments) that surround the uterus. You can have one or many fibroids. Fibroids can vary in size, weight, and where they grow in the uterus. Some can become quite large. Most fibroids do not require medical treatment. CAUSES A fibroid can develop when a single uterine cell keeps growing (replicating). Most cells in the human body have a control mechanism that keeps them from replicating without control. SIGNS AND SYMPTOMS Symptoms may include:   Heavy bleeding during your period.  Bleeding or spotting between periods.  Pelvic pain and pressure.  Bladder problems, such as needing to urinate more often (urinary frequency) or urgently.  Inability to reproduce offspring  (infertility).  Miscarriages. DIAGNOSIS Uterine fibroids are diagnosed through a physical exam. Your health care provider may feel the lumpy tumors during a pelvic exam. Ultrasonography and an MRI may be done to determine the size, location, and number of fibroids. TREATMENT Treatment may include:  Watchful waiting. This involves getting the fibroid checked by your health care provider to see if it grows or shrinks. Follow your health care provider's recommendations for how often to have this checked.  Hormone medicines. These can be taken by mouth or given through an intrauterine device (IUD).  Surgery.  Removing the fibroids (myomectomy) or the uterus (hysterectomy).  Removing blood supply to the fibroids (uterine artery embolization). If fibroids interfere with your fertility and you want to become pregnant, your health care provider may recommend having the fibroids removed.  HOME CARE INSTRUCTIONS  Keep all follow-up visits as directed by your health care provider. This is important.  Take medicines only as directed by your health care provider.  If you were prescribed a hormone treatment, take the hormone medicines exactly as directed.  Do not take aspirin, because it can cause bleeding.  Ask your health care provider about taking iron pills and increasing the amount of dark green, leafy vegetables in your diet. These actions can help to boost your blood iron levels, which may be affected by heavy menstrual bleeding.  Pay close attention to your period and tell your health care provider about any changes, such as:  Increased blood flow that requires you  to use more pads or tampons than usual per month.  A change in the number of days that your period lasts per month.  A change in symptoms that are associated with your period, such as abdominal cramping or back pain. SEEK MEDICAL CARE IF:  You have pelvic pain, back pain, or abdominal cramps that cannot be controlled with  medicines.  You have an increase in bleeding between and during periods.  You soak tampons or pads in a half hour or less.  You feel lightheaded, extra tired, or weak. SEEK IMMEDIATE MEDICAL CARE IF:  You faint.  You have a sudden increase in pelvic pain.   This information is not intended to replace advice given to you by your health care provider. Make sure you discuss any questions you have with your health care provider.   Document Released: 06/18/2000 Document Revised: 07/12/2014 Document Reviewed: 12/18/2013 Elsevier Interactive Patient Education [employer].

## 2016-02-14 NOTE — MAU Provider Note (Signed)
History     CSN: [id number]  Arrival date and time: 02/14/16 1501    First Provider Initiated Contact with Patient 02/14/16 1545      Chief Complaint  Patient presents with  . Abdominal Pain   HPI  Lynn Molina is a 45 y.o. female who presents for abdominal pain & vaginal bleeding. Had D&C and myosure 7/19 by Dr. Pamala Hurry for dysmenorrhea & AUB. During surgery had part of uterine fibroid removed. Reports feeling better until yesterday when her period started. Reports bleeding slightly heavier than normal menses with some small clots. Reports lower abdominal pain that radiates to low back. Rates pain 8/10. Has taken lyrica, tylenol, & vicodin with mild relief of pain. Lying back makes pain worse. Denies alleviating factors.  Doesn't remember if she's been seen for her post op appointment yet.  Denies fever/chills, n/v/d, constipation, or dysuria.   OB History    Gravida Para Term Preterm AB Living   2 2 2  0 0 2   SAB TAB Ectopic Multiple Live Births   0 0 0 0 2      Past Medical History:  Diagnosis Date  . Anemia    hx  . Asthma    rarely uses inhaler  . GERD (gastroesophageal reflux disease)    occasional but diet controlled, no med  . Head injury    Hammer fell on head - tx w lyrica  . Headache    tx with lyrica  . Nerve damage    Head  . 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 SVD (spontaneous vaginal delivery)    x 2    Past Surgical History:  Procedure Laterality Date  . DENTAL SURGERY     tooth ext with general anesthesia  . DILATATION & CURETTAGE/HYSTEROSCOPY WITH MYOSURE N/A 01/21/2016   Procedure: DILATATION & CURETTAGE/HYSTEROSCOPY WITH MYOSURE;  Surgeon: Aloha Gell, MD;  Location: [facility];  Service: Gynecology;  Laterality: N/A;  . TUBAL LIGATION    . WISDOM TOOTH EXTRACTION      Family History  Problem Relation Age of Onset  . Breast cancer Mother   . Heart Problems Father   . Leukemia Brother    . Heart Problems Daughter   . Prostate cancer Paternal Grandfather   . Cancer Brother   . Diabetes Paternal Grandmother   . High blood pressure Paternal Grandmother   . Diabetes Maternal Grandmother   . High blood pressure Maternal Grandmother     Social History  Substance Use Topics  . Smoking status: Never Smoker  . Smokeless tobacco: Never Used  . Alcohol use Yes     Comment: occasional wine    Allergies:  Allergies  Allergen Reactions  . Zyrtec [Cetirizine Hcl] Nausea And Vomiting    Prescriptions Prior to Admission  Medication Sig Dispense Refill Last Dose  . acetaminophen (TYLENOL) 500 MG tablet Take 500 mg by mouth every 6 (six) hours as needed for mild pain or moderate pain.   Past Week at Unknown time  . albuterol (PROVENTIL HFA;VENTOLIN HFA) 108 (90 BASE) MCG/ACT inhaler Inhale 2 puffs into the lungs every 6 (six) hours as needed for wheezing or shortness of breath.   Past Week at Unknown time  . ferrous sulfate 220 (44 Fe) MG/5ML solution Take 220 mg by mouth daily.   Past Month at Unknown time  . HYDROcodone-acetaminophen (NORCO) 5-325 MG tablet Take 1-2 tablets by mouth every 6 (  six) hours as needed for moderate pain. 20 tablet 0   . HYDROcodone-acetaminophen (NORCO/VICODIN) 5-325 MG tablet Take 1 tablet by mouth every 6 (six) hours as needed for moderate pain or severe pain.   Past Month at Unknown time  . Loratadine (ALAVERT PO) Take 1 tablet by mouth daily.    01/21/2016 at 1000  . pregabalin (LYRICA) 150 MG capsule Take 150 mg by mouth 3 (three) times daily.   01/20/2016 at Unknown time  . retapamulin (ALTABAX) 1 % ointment Apply 1 application topically 2 (two) times daily as needed (rash).    More than a month at Unknown time    Review of Systems  Constitutional: Negative for chills and fever.  Gastrointestinal: Positive for abdominal pain. Negative for constipation, diarrhea, nausea and vomiting.  Genitourinary: Negative for dysuria.       + vaginal bleeding    Physical Exam   Blood pressure 135/71, pulse 100, temperature 98.8 F (37.1 C), temperature source Oral, resp. rate 18, height 5\' 2"  (1.575 m), weight 203 lb 1.9 oz (92.1 kg), last menstrual period 02/13/2016, SpO2 97 %.  Physical Exam  Constitutional: She appears well-developed and well-nourished. She appears distressed.  HENT:  Head: Normocephalic and atraumatic.  Cardiovascular: Normal rate and regular rhythm.   No murmur heard. Respiratory: Effort normal and breath sounds normal. No respiratory distress. She has no wheezes.  GI: Soft. Bowel sounds are normal. She exhibits no distension. There is tenderness. There is no rebound.  Genitourinary: Cervix exhibits no motion tenderness. There is bleeding (small amount of dark red blood cleared out with 2 fox swabs) in the vagina.  Genitourinary Comments: Difficult to palpate uterus d/t body habitus  Neurological: She is alert.  Skin: Skin is warm and dry. She is not diaphoretic.  Psychiatric: She has a normal mood and affect. Her behavior is normal. Judgment and thought content normal.    MAU Course  Procedures Results for orders placed or performed during the hospital encounter of 02/14/16 (from the past 24 hour(s))  Urinalysis, Routine w reflex microscopic (not at [facility])     Status: Abnormal   Collection Time: 02/14/16  3:10 PM  Result Value Ref Range   Color, Urine YELLOW YELLOW   APPearance CLEAR CLEAR   Specific Gravity, Urine 1.025 1.005 - 1.030   pH 6.0 5.0 - 8.0   Glucose, UA NEGATIVE NEGATIVE mg/dL   Hgb urine dipstick MODERATE (A) NEGATIVE   Bilirubin Urine NEGATIVE NEGATIVE   Ketones, ur NEGATIVE NEGATIVE mg/dL   Protein, ur NEGATIVE NEGATIVE mg/dL   Nitrite NEGATIVE NEGATIVE   Leukocytes, UA NEGATIVE NEGATIVE  Urine microscopic-add on     Status: Abnormal   Collection Time: 02/14/16  3:10 PM  Result Value Ref Range   Squamous Epithelial / LPF 0-5 (A) NONE SEEN   WBC, UA 0-5 0 - 5 WBC/hpf   RBC / HPF 0-5 0 - 5  RBC/hpf   Bacteria, UA FEW (A) NONE SEEN   Urine-Other MUCOUS PRESENT   Pregnancy, urine POC     Status: None   Collection Time: 02/14/16  3:40 PM  Result Value Ref Range   Preg Test, Ur NEGATIVE NEGATIVE  CBC     Status: Abnormal   Collection Time: 02/14/16  4:00 PM  Result Value Ref Range   WBC 6.1 4.0 - 10.5 K/uL   RBC 4.48 3.87 - 5.11 MIL/uL   Hemoglobin 8.4 (L) 12.0 - 15.0 g/dL   HCT 28.8 (L) 36.0 - 46.0 %  MCV 64.3 (L) 78.0 - 100.0 fL   MCH 18.8 (L) 26.0 - 34.0 pg   MCHC 29.2 (L) 30.0 - 36.0 g/dL   RDW 18.6 (H) 11.5 - 15.5 %   Platelets 423 (H) 150 - 400 K/uL    MDM UPT negative CBC Toradol 60 mg IM S/w Dr. Benjie Karvonen regarding patient. Will discharge home with rx for vicodin & OCP. Pt to call office on Monday for f/u appointment Assessment and Plan  A: 1. Dysmenorrhea     P: Discharge home Rx vicodin #10 Rx OCP, take 2 tabs x 2 days followed by 1/day until pack finished Restart iron supplements Call office on Monday to scheduled f/u with Dr. Vickii Penna 02/14/2016, 3:41 PM

## 2016-04-22 ENCOUNTER — Emergency Department (HOSPITAL_COMMUNITY)
Admission: EM | Admit: 2016-04-22 | Discharge: 2016-04-22 | Disposition: A | Discharge status: Home / Self Care | Payer: Medicare HMO | Attending: Emergency Medicine | Admitting: Emergency Medicine

## 2016-04-22 ENCOUNTER — Encounter (HOSPITAL_COMMUNITY): Payer: Self-pay | Admitting: Emergency Medicine

## 2016-04-22 DIAGNOSIS — J029 Acute pharyngitis, unspecified: Secondary | ICD-10-CM | POA: Diagnosis present

## 2016-04-22 DIAGNOSIS — Z7951 Long term (current) use of inhaled steroids: Secondary | ICD-10-CM | POA: Diagnosis not present

## 2016-04-22 DIAGNOSIS — J069 Acute upper respiratory infection, unspecified: Secondary | ICD-10-CM | POA: Diagnosis not present

## 2016-04-22 DIAGNOSIS — Z79899 Other long term (current) drug therapy: Secondary | ICD-10-CM | POA: Insufficient documentation

## 2016-04-22 DIAGNOSIS — J45909 Unspecified asthma, uncomplicated: Secondary | ICD-10-CM | POA: Diagnosis not present

## 2016-04-22 HISTORY — DX: Other allergy status, other than to drugs and biological substances: Z91.09

## 2016-04-22 LAB — RAPID STREP SCREEN (MED CTR MEBANE ONLY): Streptococcus, Group A Screen (Direct): NEGATIVE

## 2016-04-22 MED ORDER — PSEUDOEPHEDRINE HCL 30 MG PO TABS: 30.0000 mg | ORAL_TABLET | Freq: Four times a day (QID) | ORAL | 0 refills | Status: DC | PRN

## 2016-04-22 MED ORDER — KETOROLAC TROMETHAMINE 60 MG/2ML IM SOLN
60.0000 mg | Freq: Once | INTRAMUSCULAR | Status: AC
  Administered 2016-04-22: 60 mg via INTRAMUSCULAR
  Filled 2016-04-22: qty 2

## 2016-04-22 MED ORDER — BENZONATATE 100 MG PO CAPS: 100.0000 mg | ORAL_CAPSULE | Freq: Three times a day (TID) | ORAL | 0 refills | Status: DC

## 2016-04-22 NOTE — ED Triage Notes (Signed)
Patient reports that about 2 days ago her throat starting to hurt and a burning sensation. Also having chills, fever, generalized body aches, cough. Fever at home highest 101.15F after tylenol.

## 2016-04-22 NOTE — Discharge Instructions (Signed)
Continue ibuprofen and tylenol for pain and fever. Salt water gargles. Saline nasal spray. Drink plenty of fluids. Rest. Take tessalon for cough. Take sudafed for congestion. Follow up with your family doctor. Return if worsening.

## 2016-04-22 NOTE — ED Provider Notes (Signed)
[facility] DEPT Provider Note   CSN: [id number] Arrival date & time: 04/22/16  1637  By signing my name below, I, Arianna Nassar, attest that this documentation has been prepared under the direction and in the presence of Broc Caspers, PA-C.  Electronically Signed: Julien Nordmann, ED Scribe. 04/22/16. 5:07 PM.    History   Chief Complaint Chief Complaint  Patient presents with  . Sore Throat  . Generalized Body Aches   The history is provided by the patient. No language interpreter was used.   HPI Comments: Lynn Molina is a 45 y.o. female who presents to the Emergency Department complaining of sudden onset, gradual worsening, moderate cold-like symptoms x 2 days. Pt has associated nasal congestion, rhinorrhea, fatigue, productive cough with mucus-like phlegm, fever (Tmax 101.5), chills, sore throat, headache, and generalized body aches. She has taken tylenol, advil, allergy medication and an herbal medication to alleviate her symptoms with no relief. Pt has been drinking fluids normally. She has been around her niece and daughter who have been sick recently. She denies ear pain.  Past Medical History:  Diagnosis Date  . Anemia    hx  . Asthma    rarely uses inhaler  . Environmental allergies   . GERD (gastroesophageal reflux disease)    occasional but diet controlled, no med  . Head injury    Hammer fell on head - tx w lyrica  . Headache    tx with lyrica  . Nerve damage    Head  . 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 SVD (spontaneous vaginal delivery)    x 2    Patient Active Problem List   Diagnosis Date Noted  . 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 DENTAL SURGERY     tooth ext with general anesthesia  . DILATATION & CURETTAGE/HYSTEROSCOPY WITH MYOSURE N/A 01/21/2016   Procedure: DILATATION & CURETTAGE/HYSTEROSCOPY WITH MYOSURE;  Surgeon: Aloha Gell, MD;  Location: [facility];  Service: Gynecology;  Laterality: N/A;  . TUBAL LIGATION    . WISDOM TOOTH EXTRACTION      OB History    Gravida Para Term Preterm AB Living   2 2 2  0 0 2   SAB TAB Ectopic Multiple Live Births   0 0 0 0 2       Home Medications    Prior to Admission medications   Medication Sig Start Date End Date Taking? Authorizing Provider  acetaminophen (TYLENOL) 500 MG tablet Take 500 mg by mouth every 6 (six) hours as needed for mild pain, moderate pain or headache.     Historical Provider, MD  albuterol (PROVENTIL HFA;VENTOLIN HFA) 108 (90 BASE) MCG/ACT inhaler Inhale 2 puffs into the lungs every 6 (six) hours as needed for wheezing or shortness of breath.    Historical Provider, MD  ferrous sulfate 220 (44 Fe) MG/5ML solution Take 660 mg by mouth daily with breakfast.     Historical Provider, MD  HYDROcodone-acetaminophen (NORCO/VICODIN) 5-325 MG tablet Take 2 tablets by mouth every 6 (six) hours as needed. 02/14/16   Jorje Guild, NP  loratadine (CLARITIN) 10 MG tablet Take 10 mg by mouth daily.    Historical Provider, MD  norgestimate-ethinyl estradiol (ORTHO-CYCLEN,SPRINTEC,PREVIFEM) 0.25-35 MG-MCG tablet 2 tabs per day x 2 days, then 1 tab per day until finished 02/14/16   Jorje Guild, NP  pregabalin (LYRICA) 150 MG capsule Take 150  mg by mouth 3 (three) times daily.    Historical Provider, MD  retapamulin (ALTABAX) 1 % ointment Apply 1 application topically 2 (two) times daily as needed (for rash).     Historical Provider, MD  tranexamic acid (LYSTEDA) 650 MG TABS tablet Take 1,300 mg by mouth 3 (three) times daily.    Historical Provider, MD    Family History Family History  Problem Relation Age of Onset  . Breast cancer Mother   . Heart Problems Father   . Leukemia Brother   . Heart Problems Daughter   . Prostate cancer Paternal Grandfather   . Cancer Brother   . Diabetes Paternal Grandmother   . High blood pressure Paternal Grandmother   .  Diabetes Maternal Grandmother   . High blood pressure Maternal Grandmother     Social History Social History  Substance Use Topics  . Smoking status: Never Smoker  . Smokeless tobacco: Never Used  . Alcohol use Yes     Comment: occasional wine     Allergies   Zyrtec [cetirizine hcl]   Review of Systems Review of Systems  Constitutional: Positive for chills, fatigue and fever. Negative for appetite change.  HENT: Positive for congestion, rhinorrhea and sore throat. Negative for ear pain.   Respiratory: Positive for cough.   Gastrointestinal: Negative for vomiting.  All other systems reviewed and are negative.    Physical Exam Updated Vital Signs BP 128/75   Pulse 115   Temp 100.7 F (38.2 C) (Oral)   Resp 17   Ht 5' 2.5" (1.588 m)   Wt 203 lb (92.1 kg)   SpO2 95%   BMI 36.54 kg/m   Physical Exam  Constitutional: She appears well-developed and well-nourished. No distress.  HENT:  Head: Normocephalic.  Right Ear: Tympanic membrane, external ear and ear canal normal.  Left Ear: Tympanic membrane, external ear and ear canal normal.  Nose: Rhinorrhea present.  Mouth/Throat: Uvula is midline, oropharynx is clear and moist and mucous membranes are normal. No oropharyngeal exudate, posterior oropharyngeal edema or posterior oropharyngeal erythema.  Eyes: Conjunctivae are normal.  Neck: Neck supple.  Cardiovascular: Normal rate, regular rhythm and normal heart sounds.   Pulmonary/Chest: Effort normal and breath sounds normal. No respiratory distress. She has no wheezes. She has no rales.  Musculoskeletal: She exhibits no edema.  Neurological: She is alert.  Skin: Skin is warm and dry.  Psychiatric: She has a normal mood and affect. Her behavior is normal.  Nursing note and vitals reviewed.    ED Treatments / Results  DIAGNOSTIC STUDIES: Oxygen Saturation is 95% on RA, normal by my interpretation.  COORDINATION OF CARE:  5:05 PM Discussed treatment plan with pt  at bedside and pt agreed to plan.  Labs (all labs ordered are listed, but only abnormal results are displayed) Labs Reviewed - No data to display  EKG  EKG Interpretation None       Radiology No results found.  Procedures Procedures (including critical care time)  Medications Ordered in ED Medications - No data to display   Initial Impression / Assessment and Plan / ED Course  I have reviewed the triage vital signs and the nursing notes.  Pertinent labs & imaging results that were available during my care of the patient were reviewed by me and considered in my medical decision making (see chart for details).  Clinical Course   Patient in emergency department for URI symptoms. She is having low-grade fever, nasal congestion, cough, sore throat. Her  lungs are clear. Oropharynx is normal, uvula midline. Ears are normal bilaterally. She does have nasal congestion. Rapid strep obtained and is negative. Oxygen saturation 96% on room air, doubt pneumonia, symptoms for only 2 days. Will treat symptomatically, follow up with family doctor. Return precautions discussed.  Vitals:   04/22/16 1644 04/22/16 1648 04/22/16 1656 04/22/16 1803  BP: 128/75   117/81  Pulse: 115  106 99  Resp: 17   18  Temp: 100.7 F (38.2 C)     TempSrc: Oral     SpO2: 95%  96% 99%  Weight:  92.1 kg    Height:  5' 2.5" (1.588 m)     I personally performed the services described in this documentation, which was scribed in my presence. The recorded information has been reviewed and is accurate.   Final Clinical Impressions(s) / ED Diagnoses   Final diagnoses:  Upper respiratory tract infection, unspecified type    New Prescriptions New Prescriptions   No medications on file     Jeannett Senior, PA-C 04/22/16 [place], MD 04/23/16 2216

## 2016-04-25 LAB — CULTURE, GROUP A STREP (THRC)

## 2016-10-31 ENCOUNTER — Inpatient Hospital Stay (HOSPITAL_COMMUNITY): Payer: Medicare HMO

## 2016-10-31 ENCOUNTER — Encounter (HOSPITAL_COMMUNITY): Payer: Self-pay | Admitting: *Deleted

## 2016-10-31 ENCOUNTER — Inpatient Hospital Stay (HOSPITAL_COMMUNITY)
Admission: AD | Admit: 2016-10-31 | Discharge: 2016-11-01 | Disposition: A | Discharge status: Home / Self Care | Payer: Medicare HMO | Source: Ambulatory Visit | Attending: Emergency Medicine | Admitting: Emergency Medicine

## 2016-10-31 DIAGNOSIS — J45909 Unspecified asthma, uncomplicated: Secondary | ICD-10-CM | POA: Diagnosis not present

## 2016-10-31 DIAGNOSIS — K219 Gastro-esophageal reflux disease without esophagitis: Secondary | ICD-10-CM | POA: Diagnosis not present

## 2016-10-31 DIAGNOSIS — R079 Chest pain, unspecified: Secondary | ICD-10-CM | POA: Diagnosis not present

## 2016-10-31 DIAGNOSIS — R0789 Other chest pain: Secondary | ICD-10-CM

## 2016-10-31 HISTORY — DX: Ocular pain, right eye: H57.11

## 2016-10-31 LAB — I-STAT TROPONIN, ED: Troponin i, poc: 0 ng/mL (ref 0.00–0.08)

## 2016-10-31 LAB — I-STAT BETA HCG BLOOD, ED (MC, WL, AP ONLY): I-stat hCG, quantitative: <5 m[IU]/mL (ref <5)

## 2016-10-31 MED ORDER — GI COCKTAIL ~~LOC~~
30.0000 mL | Freq: Once | ORAL | Status: AC
  Administered 2016-10-31: 30 mL via ORAL
  Filled 2016-10-31: qty 30

## 2016-10-31 NOTE — ED Provider Notes (Signed)
[facility] DEPT Provider Note   CSN: [id number] Arrival date & time: 10/31/16  2112 By signing my name below, I, Lynn Molina, attest that this documentation has been prepared under the direction and in the presence of Lynn Essex, MD . Electronically Signed: Dyke Molina, Scribe. 10/31/2016. 11:10 PM.   History   Chief Complaint Chief Complaint  Patient presents with  . Chest Pain   HPI Lynn Molina is a 46 y.o. female, brought in by [facility] from [facility], with a history of GERD  who presents to the Emergency Department complaining of intermittent right-sided/central chest pain onset at 5 this PM while at rest. Per pt, her pain lasts for a few hours before resolving. She describes this as sharp, pressure-like pain that is exacerbated by deep inspiration and sitting up. She reports associated intermittent pain to her right arm, and also reports increased fatigue and stress. Per pt, this does not feel like her GERD symptoms, and states it feels "like gas". Pain was initially alleviated by lying down. She was seen at [facility] for the same just PTA where she was given a GI cocktail with relief of symptoms. Pt is not currently pregnant and is not on any hormonal  birth control. No personal history of MI, CAD, CHF, HTN, DM. Pt denies any nausea, vomiting, diaphoresis, SOB, leg swelling, or any other acute complaints at this time.   The history is provided by the patient. No language interpreter was used.   Past Medical History:  Diagnosis Date  . Anemia    hx  . Asthma    rarely uses inhaler  . Environmental allergies   . Eye pain, right   . GERD (gastroesophageal reflux disease)    occasional but diet controlled, no med  . Head injury    Hammer fell on head - tx w lyrica  . Headache    tx with lyrica  . Nerve damage    Head  . 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 SVD (spontaneous vaginal delivery)    x 2      Patient Active Problem List   Diagnosis Date Noted  . 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 DENTAL SURGERY     tooth ext with general anesthesia  . DILATATION & CURETTAGE/HYSTEROSCOPY WITH MYOSURE N/A 01/21/2016   Procedure: DILATATION & CURETTAGE/HYSTEROSCOPY WITH MYOSURE;  Surgeon: Aloha Gell, MD;  Location: [facility];  Service: Gynecology;  Laterality: N/A;  . TUBAL LIGATION    . WISDOM TOOTH EXTRACTION      OB History    Gravida Para Term Preterm AB Living   2 2 2  0 0 2   SAB TAB Ectopic Multiple Live Births   0 0 0 0 2       Home Medications    Prior to Admission medications   Medication Sig Start Date End Date Taking? Authorizing Provider  acetaminophen (TYLENOL) 500 MG tablet Take 500 mg by mouth every 6 (six) hours as needed for mild pain, moderate pain or headache.    Yes Historical Provider, MD  albuterol (PROVENTIL HFA;VENTOLIN HFA) 108 (90 BASE) MCG/ACT inhaler Inhale 2 puffs into the lungs every 6 (six) hours as needed for wheezing or shortness of breath.   Yes Historical Provider, MD  ferrous sulfate 220 (44 Fe) MG/5ML solution Take 660 mg by mouth daily with breakfast.    Yes Historical  Provider, MD  HYDROcodone-acetaminophen (NORCO/VICODIN) 5-325 MG tablet Take 2 tablets by mouth every 6 (six) hours as needed. Patient taking differently: Take 2 tablets by mouth every 6 (six) hours as needed for moderate pain or severe pain.  02/14/16  Yes Lynn Guild, NP  pregabalin (LYRICA) 150 MG capsule Take 150 mg by mouth 3 (three) times daily.   Yes Historical Provider, MD  tranexamic acid (LYSTEDA) 650 MG TABS tablet Take 1,300 mg by mouth 3 (three) times daily.   Yes Historical Provider, MD  benzonatate (TESSALON) 100 MG capsule Take 1 capsule (100 mg total) by mouth every 8 (eight) hours. 04/22/16   Lynn Kirichenko, PA-C  norgestimate-ethinyl estradiol (ORTHO-CYCLEN,SPRINTEC,PREVIFEM) 0.25-35 MG-MCG tablet 2  tabs per day x 2 days, then 1 tab per day until finished 02/14/16   Lynn Guild, NP  pseudoephedrine (SUDAFED) 30 MG tablet Take 1 tablet (30 mg total) by mouth every 6 (six) hours as needed for congestion. 04/22/16   Lynn Senior, PA-C    Family History Family History  Problem Relation Age of Onset  . Breast cancer Mother   . Heart Problems Father   . Leukemia Brother   . Heart Problems Daughter   . Prostate cancer Paternal Grandfather   . Cancer Brother   . Diabetes Paternal Grandmother   . High blood pressure Paternal Grandmother   . Diabetes Maternal Grandmother   . High blood pressure Maternal Grandmother     Social History Social History  Substance Use Topics  . Smoking status: Never Smoker  . Smokeless tobacco: Never Used  . Alcohol use Yes     Comment: occasional wine     Allergies   Tessalon [benzonatate] and Zyrtec [cetirizine hcl]   Review of Systems Review of Systems All systems reviewed and are negative for acute change except as noted in the HPI.  Physical Exam Updated Vital Signs BP 126/69   Pulse 83   Temp 98.4 F (36.9 C)   Resp 16   Ht 5\' 3"  (1.6 m)   Wt 207 lb (93.9 kg)   LMP 10/10/2016   SpO2 97%   BMI 36.67 kg/m   Physical Exam  Constitutional: She is oriented to person, place, and time. She appears well-developed and well-nourished. No distress.  Obese  HENT:  Head: Normocephalic and atraumatic.  Mouth/Throat: Oropharynx is clear and moist. No oropharyngeal exudate.  Eyes: Conjunctivae and EOM are normal. Pupils are equal, round, and reactive to light.  Neck: Normal range of motion. Neck supple.  No meningismus.  Cardiovascular: Normal rate, regular rhythm, normal heart sounds and intact distal pulses.   No murmur heard. Pulmonary/Chest: Effort normal and breath sounds normal. No respiratory distress. She exhibits tenderness (central chest).  Abdominal: Soft. There is tenderness (epigastric). There is no rebound and no  guarding.  Musculoskeletal: Normal range of motion. She exhibits no edema or tenderness.  Neurological: She is alert and oriented to person, place, and time. No cranial nerve deficit. She exhibits normal muscle tone. Coordination normal.   5/5 strength throughout. CN 2-12 intact.Equal grip strength.   Skin: Skin is warm.  Psychiatric: She has a normal mood and affect. Her behavior is normal.  Nursing note and vitals reviewed.  ED Treatments / Results  DIAGNOSTIC STUDIES:  Oxygen Saturation is 97% on RA, normal by my interpretation.    COORDINATION OF CARE:  11:08 PM Pt declined pain medication. Discussed treatment plan with pt at bedside and pt agreed to plan.   Labs (all labs  ordered are listed, but only abnormal results are displayed) Labs Reviewed  BASIC METABOLIC PANEL - Abnormal; Notable for the following:       Result Value   Glucose, Bld 105 (*)    All other components within normal limits  CBC - Abnormal; Notable for the following:    Hemoglobin 8.2 (*)    HCT 27.9 (*)    MCV 66.1 (*)    MCH 19.4 (*)    MCHC 29.4 (*)    RDW 17.8 (*)    Platelets 642 (*)    All other components within normal limits  HEPATIC FUNCTION PANEL - Abnormal; Notable for the following:    Bilirubin, Direct <0.1 (*)    All other components within normal limits  LIPASE, BLOOD  D-DIMER, QUANTITATIVE (NOT AT [facility])  I-STAT TROPOININ, ED  I-STAT BETA HCG BLOOD, ED ([facility], [facility], [facility] ONLY)    EKG  EKG Interpretation  Date/Time:  Monday November 01 2016 00:31:37 EDT Ventricular Rate:  69 PR Interval:    QRS Duration: 98 QT Interval:  397 QTC Calculation: 426 R Axis:   10 Text Interpretation:  Sinus rhythm Borderline T abnormalities, anterior leads Nonspecific T wave abnormality Confirmed by Wyvonnia Dusky  MD, Ruhi Kopke [phone]) on 11/01/2016 12:35:18 AM       Radiology Dg Chest 2 View  Result Date: 11/01/2016 CLINICAL DATA:  Chest pain. EXAM: CHEST  2 VIEW COMPARISON:  May 24, 2006 FINDINGS: The  heart size is borderline. No pneumothorax. No pulmonary nodules, masses, or focal infiltrates. No acute abnormalities are identified. IMPRESSION: No active cardiopulmonary disease. Electronically Signed   By: Dorise Bullion III M.D   On: 11/01/2016 00:19    Procedures Procedures (including critical care time)  Medications Ordered in ED Medications  gi cocktail (Maalox,Lidocaine,Donnatal) (30 mLs Oral Given 10/31/16 2158)     Initial Impression / Assessment and Plan / ED Course  I have reviewed the triage vital signs and the nursing notes.  Pertinent labs & imaging results that were available during my care of the patient were reviewed by me and considered in my medical decision making (see chart for details).     Patient transferred from [facility] with intermittent central and right-sided chest pain since approximately 5 PM. Does not radiate. No shortness of breath, nausea or diaphoresis. Patient relates increased fatigue and stress and feels this is "gas pain".  EKG nsr.  Central chest tenderness.  No RUQ tenderness.  Labs show stable anemia. Troponin and d-dimer are negative. Low suspicion for ACS or PE. LFTs and lipase normal.  Doubt GB pathology. Heart score 2.  Chest pain is improved. Troponin is negative 2. Low suspicion for ACS. Patient to follow up with cardiology for stress test. Return precautions discussed.  Final Clinical Impressions(s) / ED Diagnoses   Final diagnoses:  Chest pain at rest    New Prescriptions New Prescriptions   No medications on file   I personally performed the services described in this documentation, which was scribed in my presence. The recorded information has been reviewed and is accurate.    Lynn Essex, MD 11/01/16 [phone]

## 2016-10-31 NOTE — ED Notes (Signed)
Bed: WA07 Expected date:  Expected time:  Means of arrival:  Comments: [facility]

## 2016-10-31 NOTE — ED Triage Notes (Signed)
[facility] this evening with c/o right sided/central chest pain, not associated with other symptoms. Pt reports she has had increased fatigue, stress and it felt like gas. The did an ekg and gave her GI cocktail which decreased her pain. Pt ambulatory to restroom, NAD.

## 2016-10-31 NOTE — MAU Provider Note (Signed)
History   cc: chest pain, dizziness HPI . 46 yo G2P2 BF presents with c/o chest pain since 5 pm. Pt denies any prior hx of CP. She denies pain radiating to her jaw or down her arm. She was unsure if SOB with movement. Pt c/o dizziness  And feeling disoriented. She denies HTN, DM, smoking hx. Pt notes she has anemia but does not take any iron tab; LMP 3 weeks ago. (+) headache currently    OB History    Gravida Para Term Preterm AB Living   2 2 2  0 0 2   SAB TAB Ectopic Multiple Live Births   0 0 0 0 2      Past Medical History:  Diagnosis Date  . Anemia    hx  . Asthma    rarely uses inhaler  . Environmental allergies   . GERD (gastroesophageal reflux disease)    occasional but diet controlled, no med  . Head injury    Hammer fell on head - tx w lyrica  . Headache    tx with lyrica  . Nerve damage    Head  . 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 SVD (spontaneous vaginal delivery)    x 2    Past Surgical History:  Procedure Laterality Date  . DENTAL SURGERY     tooth ext with general anesthesia  . DILATATION & CURETTAGE/HYSTEROSCOPY WITH MYOSURE N/A 01/21/2016   Procedure: DILATATION & CURETTAGE/HYSTEROSCOPY WITH MYOSURE;  Surgeon: Aloha Gell, MD;  Location: [facility];  Service: Gynecology;  Laterality: N/A;  . TUBAL LIGATION    . WISDOM TOOTH EXTRACTION      Family History  Problem Relation Age of Onset  . Breast cancer Mother   . Heart Problems Father   . Leukemia Brother   . Heart Problems Daughter   . Prostate cancer Paternal Grandfather   . Cancer Brother   . Diabetes Paternal Grandmother   . High blood pressure Paternal Grandmother   . Diabetes Maternal Grandmother   . High blood pressure Maternal Grandmother     Social History  Substance Use Topics  . Smoking status: Never Smoker  . Smokeless tobacco: Never Used  . Alcohol use Yes     Comment: occasional wine    Allergies:  Allergies  Allergen  Reactions  . Zyrtec [Cetirizine Hcl] Nausea And Vomiting    Prescriptions Prior to Admission  Medication Sig Dispense Refill Last Dose  . acetaminophen (TYLENOL) 500 MG tablet Take 500 mg by mouth every 6 (six) hours as needed for mild pain, moderate pain or headache.    02/14/2016 at 1300  . albuterol (PROVENTIL HFA;VENTOLIN HFA) 108 (90 BASE) MCG/ACT inhaler Inhale 2 puffs into the lungs every 6 (six) hours as needed for wheezing or shortness of breath.   Past Month at Unknown time  . benzonatate (TESSALON) 100 MG capsule Take 1 capsule (100 mg total) by mouth every 8 (eight) hours. 21 capsule 0   . ferrous sulfate 220 (44 Fe) MG/5ML solution Take 660 mg by mouth daily with breakfast.    Past Week at Unknown time  . HYDROcodone-acetaminophen (NORCO/VICODIN) 5-325 MG tablet Take 2 tablets by mouth every 6 (six) hours as needed. 10 tablet 0   . loratadine (CLARITIN) 10 MG tablet Take 10 mg by mouth daily.   02/14/2016 at Unknown time  . norgestimate-ethinyl estradiol (ORTHO-CYCLEN,SPRINTEC,PREVIFEM) 0.25-35 MG-MCG tablet 2 tabs per day x 2  days, then 1 tab per day until finished 1 Package 0   . pregabalin (LYRICA) 150 MG capsule Take 150 mg by mouth 3 (three) times daily.   02/13/2016 at Unknown time  . pseudoephedrine (SUDAFED) 30 MG tablet Take 1 tablet (30 mg total) by mouth every 6 (six) hours as needed for congestion. 20 tablet 0   . retapamulin (ALTABAX) 1 % ointment Apply 1 application topically 2 (two) times daily as needed (for rash).    Past Month at Unknown time  . tranexamic acid (LYSTEDA) 650 MG TABS tablet Take 1,300 mg by mouth 3 (three) times daily.   02/14/2016 at Unknown time     Physical Exam   Blood pressure 135/65, pulse 93, temperature 98.7 F (37.1 C), temperature source Oral, resp. rate 16, height 5\' 3"  (1.6 m), weight 93.9 kg (207 lb), SpO2 100 %.  BP 135/65 (BP Location: Right Arm)   Pulse 93   Temp 98.7 F (37.1 C) (Oral)   Resp 16   Ht 5\' 3"  (1.6 m)   Wt 93.9 kg  (207 lb)   SpO2 100%   BMI 36.67 kg/m    WDWN obese BF in NAD Lungs: clear to auscultation bilaterally Heart: regular rate and rhythm, S1, S2 normal, no murmur, click, rub or gallop Abdomen: soft obese nontender Pelvic: deferred Extremities: no edema, redness or tenderness in the calves or thighs  EKG NSR  ED Course  IMP: Chest pain P) transfer to [facility] for further mgmt. Empiric GI cocktail. Spoke with ER physician Dr Canary Brim who accepts pt MDM   Marvene Staff, MD 9:50 PM 10/31/2016

## 2016-11-01 ENCOUNTER — Telehealth: Payer: Self-pay | Admitting: Physician Assistant

## 2016-11-01 DIAGNOSIS — R079 Chest pain, unspecified: Secondary | ICD-10-CM | POA: Diagnosis not present

## 2016-11-01 LAB — BASIC METABOLIC PANEL
ANION GAP: 9 (ref 5–15)
BUN: 11 mg/dL (ref 6–20)
CHLORIDE: 103 mmol/L (ref 101–111)
CO2: 25 mmol/L (ref 22–32)
CREATININE: 0.83 mg/dL (ref 0.44–1.00)
Calcium: 8.9 mg/dL (ref 8.9–10.3)
GFR calc non Af Amer: >60 mL/min (ref >60)
Glucose, Bld: 105 mg/dL — ABNORMAL HIGH (ref 65–99)
Potassium: 3.6 mmol/L (ref 3.5–5.1)
Sodium: 137 mmol/L (ref 135–145)

## 2016-11-01 LAB — CBC
HEMATOCRIT: 27.9 % — AB (ref 36.0–46.0)
HEMOGLOBIN: 8.2 g/dL — AB (ref 12.0–15.0)
MCH: 19.4 pg — ABNORMAL LOW (ref 26.0–34.0)
MCHC: 29.4 g/dL — AB (ref 30.0–36.0)
MCV: 66.1 fL — ABNORMAL LOW (ref 78.0–100.0)
Platelets: 642 10*3/uL — ABNORMAL HIGH (ref 150–400)
RBC: 4.22 MIL/uL (ref 3.87–5.11)
RDW: 17.8 % — ABNORMAL HIGH (ref 11.5–15.5)
WBC: 7.7 10*3/uL (ref 4.0–10.5)

## 2016-11-01 LAB — LIPASE, BLOOD: Lipase: 26 U/L (ref 11–51)

## 2016-11-01 LAB — HEPATIC FUNCTION PANEL
ALBUMIN: 3.9 g/dL (ref 3.5–5.0)
ALK PHOS: 62 U/L (ref 38–126)
ALT: 17 U/L (ref 14–54)
AST: 21 U/L (ref 15–41)
BILIRUBIN TOTAL: 0.4 mg/dL (ref 0.3–1.2)
Bilirubin, Direct: <0.1 mg/dL — ABNORMAL LOW (ref 0.1–0.5)
Total Protein: 7.6 g/dL (ref 6.5–8.1)

## 2016-11-01 LAB — D-DIMER, QUANTITATIVE: D-Dimer, Quant: <0.27 ug/mL-FEU (ref 0.00–0.50)

## 2016-11-01 LAB — I-STAT TROPONIN, ED: Troponin i, poc: 0 ng/mL (ref 0.00–0.08)

## 2016-11-01 MED ORDER — OMEPRAZOLE 20 MG PO CPDR: 20.0000 mg | DELAYED_RELEASE_CAPSULE | Freq: Every day | ORAL | 0 refills | Status: DC

## 2016-11-01 NOTE — ED Notes (Signed)
ED Provider at bedside. 

## 2016-11-01 NOTE — Telephone Encounter (Signed)
New Message   Pt was a ER referral. Made appt for May 14 with Nell Range. Offered her first available, but wants to be worked in sooner. Advised her I would let nurse know.

## 2016-11-01 NOTE — Discharge Instructions (Signed)
There is no evidence of heart attack or blood clot in the lung. Follow-up with the cardiologist for a stress test. Return to the ED if you develop new or worsening symptoms.

## 2016-11-01 NOTE — Telephone Encounter (Signed)
Rescheduled the patient to see Dr. Debara Pickett on 11/04/16 at 10:20- new patient slot available. I have notified the patient of this opening and the change of location. She is agreeable and would like to be seen then.  Confirmed appt date/ time/ location with the patient.

## 2016-11-04 ENCOUNTER — Ambulatory Visit (INDEPENDENT_AMBULATORY_CARE_PROVIDER_SITE_OTHER): Payer: Medicare HMO | Admitting: Internal Medicine

## 2016-11-04 ENCOUNTER — Encounter: Payer: Self-pay | Admitting: Internal Medicine

## 2016-11-04 VITALS — BP 139/88 | HR 86 | Ht 63.0 in | Wt 217.0 lb

## 2016-11-04 DIAGNOSIS — G44309 Post-traumatic headache, unspecified, not intractable: Secondary | ICD-10-CM | POA: Diagnosis not present

## 2016-11-04 DIAGNOSIS — R0789 Other chest pain: Secondary | ICD-10-CM | POA: Diagnosis not present

## 2016-11-04 DIAGNOSIS — R410 Disorientation, unspecified: Secondary | ICD-10-CM

## 2016-11-04 DIAGNOSIS — S0990XS Unspecified injury of head, sequela: Secondary | ICD-10-CM

## 2016-11-04 DIAGNOSIS — Z87820 Personal history of traumatic brain injury: Secondary | ICD-10-CM

## 2016-11-04 NOTE — Progress Notes (Signed)
OFFICE FOLLOW-UP NOTE  Chief Complaint:  Follow-up ER visit for chest pain  Primary Care Physician: Ricke Hey, MD  HPI:  Lynn Molina is a 46 y.o. female with a past medial history significant for asthma, allergies and occasional reflux. 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 She reports a number of years back having had a hammer fall off of the roof and hit her in the head. She apparently saw a neurologist in [facility] who did not feel that there is any significant injury and had a CT scan which did not show any cranial or cortical damage. Recently she had been out shopping with her mother and was going to [employer]. She was trying to give her mother directions and she noted that she was somewhat confused. This was in addition to having a significant headache with some light sensitivity. Apparently when she got to the furniture store she had difficulty in remembering certain things. She then apparently went to lunch and started to have some chest pain. This was sharp and fairly constant. It started to get better and then came back. She then went home and went to sleep. She says that when she woke up the pain was still there and started to get worse. At that point her mother suggested she go to the emergency department. There she ruled out for MI. I personally reviewed her EKG from the emergency department which showed normal sinus rhythm and no ischemic changes. Since that episode she denies any further chest pain or worsening shortness of breath. She does not have any symptoms with exertion. She has few cardiac risk factors other than obesity.  PMHx:  Past Medical History:  Diagnosis Date  . Anemia    hx  . Asthma    rarely uses inhaler  . Environmental allergies   . Eye pain, right   . GERD (gastroesophageal reflux disease)    occasional but diet controlled, no med  . Head injury    Hammer  fell on head - tx w lyrica  . Headache    tx with lyrica  . Nerve damage    Head  . 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 SVD (spontaneous vaginal delivery)    x 2    Past Surgical History:  Procedure Laterality Date  . DENTAL SURGERY     tooth ext with general anesthesia  . DILATATION & CURETTAGE/HYSTEROSCOPY WITH MYOSURE N/A 01/21/2016   Procedure: DILATATION & CURETTAGE/HYSTEROSCOPY WITH MYOSURE;  Surgeon: Aloha Gell, MD;  Location: [facility];  Service: Gynecology;  Laterality: N/A;  . TUBAL LIGATION    . WISDOM TOOTH EXTRACTION      FAMHx:  Family History  Problem Relation Age of Onset  . Breast cancer Mother   . Heart Problems Father   . Leukemia Brother   . Heart Problems Daughter   . Prostate cancer Paternal Grandfather   . Cancer Brother   . Diabetes Paternal Grandmother   . High blood pressure Paternal Grandmother   . Diabetes Maternal Grandmother   . High blood pressure Maternal Grandmother     SOCHx:   reports that she has never smoked. She has never used smokeless tobacco. She reports that she drinks alcohol. She reports that she does not use drugs.  ALLERGIES:  Allergies  Allergen Reactions  . Tessalon [Benzonatate] Anaphylaxis and Shortness Of  Breath    Swollen face, throat swelling up  . Zyrtec [Cetirizine Hcl] Nausea And Vomiting    ROS: Pertinent items noted in HPI and remainder of comprehensive ROS otherwise negative.  HOME MEDS: Current Outpatient Prescriptions on File Prior to Visit  Medication Sig Dispense Refill  . acetaminophen (TYLENOL) 500 MG tablet Take 500 mg by mouth every 6 (six) hours as needed for mild pain, moderate pain or headache.     . albuterol (PROVENTIL HFA;VENTOLIN HFA) 108 (90 BASE) MCG/ACT inhaler Inhale 2 puffs into the lungs every 6 (six) hours as needed for wheezing or shortness of breath.    . ferrous sulfate 220 (44 Fe) MG/5ML solution Take 660 mg by mouth daily with  breakfast.     . HYDROcodone-acetaminophen (NORCO/VICODIN) 5-325 MG tablet Take 2 tablets by mouth every 6 (six) hours as needed. (Patient taking differently: Take 2 tablets by mouth every 6 (six) hours as needed for moderate pain or severe pain. ) 10 tablet 0  . pregabalin (LYRICA) 150 MG capsule Take 150 mg by mouth 3 (three) times daily.    . tranexamic acid (LYSTEDA) 650 MG TABS tablet Take 1,300 mg by mouth 3 (three) times daily.     No current facility-administered medications on file prior to visit.     LABS/IMAGING: No results found for this or any previous visit (from the past 48 hour(s)). No results found.  LIPID PANEL: No results found for: CHOL, TRIG, HDL, CHOLHDL, VLDL, LDLCALC, LDLDIRECT   WEIGHTS: Wt Readings from Last 3 Encounters:  11/04/16 217 lb (98.4 kg)  10/31/16 207 lb (93.9 kg)  04/22/16 203 lb (92.1 kg)    VITALS: BP 139/88 (Cuff Size: Large)   Pulse 86   Ht 5\' 3"  (1.6 m)   Wt 217 lb (98.4 kg)   LMP 10/10/2016   BMI 38.44 kg/m   EXAM: General appearance: alert, no distress and moderately obese Neck: no carotid bruit and no JVD Lungs: clear to auscultation bilaterally Heart: regular rate and rhythm Abdomen: soft, non-tender; bowel sounds normal; no masses,  no organomegaly Extremities: extremities normal, atraumatic, no cyanosis or edema Pulses: 2+ and symmetric Skin: Skin color, texture, turgor normal. No rashes or lesions Neurologic: Grossly normal Psych: Pleasant  EKG: (see HPI)  ASSESSMENT: 1. Noncardiac chest pain 2. Possible migraine associated chest pain  3. 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   Mrs. Lykins does not seem to be describing cardiac chest pain. She's not had any further episodes. She has few if any risk factors for coronary disease and is of young age. I do not feel any further cardiac workup is indicated. She does have this unusual history of headaches which seem to be associated with possible traumatic  brain injury. Although she's had some improvement on Lyrica, she continues to have some headaches, and had an unusual episode of confusion and associated chest discomfort. I would like her to see a neurologist for another opinion regarding her headaches and confusion.  Follow-up with me as needed.  Pixie Casino, MD, [place]  Attending Cardiologist  Direct Dial: [phone]  Fax: [phone]  Website:  [email or web address] 11/04/2016, 1:18 PM

## 2016-11-04 NOTE — Patient Instructions (Addendum)
You have been referred to [facility] Neurology - Dr. Ellouise Newer  Your physician recommends that you schedule a follow-up appointment as needed.

## 2016-11-15 ENCOUNTER — Ambulatory Visit: Payer: Medicare HMO | Admitting: Physician Assistant

## 2016-11-22 ENCOUNTER — Encounter: Payer: Self-pay | Admitting: Neurology

## 2017-01-27 ENCOUNTER — Encounter: Payer: Self-pay | Admitting: Gastroenterology

## 2017-01-27 ENCOUNTER — Ambulatory Visit: Payer: BC Managed Care – PPO | Admitting: Gastroenterology

## 2017-01-27 VITALS — BP 120/86 | HR 95 | Temp 98.2°F | Resp 16 | Ht 64.0 in | Wt 167.5 lb

## 2017-01-27 DIAGNOSIS — R197 Diarrhea, unspecified: Secondary | ICD-10-CM

## 2017-01-27 DIAGNOSIS — R160 Hepatomegaly, not elsewhere classified: Principal | ICD-10-CM

## 2017-01-27 DIAGNOSIS — R194 Change in bowel habit: Secondary | ICD-10-CM

## 2017-01-27 DIAGNOSIS — R7989 Other specified abnormal findings of blood chemistry: Secondary | ICD-10-CM

## 2017-01-27 DIAGNOSIS — E7800 Pure hypercholesterolemia, unspecified: Secondary | ICD-10-CM

## 2017-01-27 DIAGNOSIS — K219 Gastro-esophageal reflux disease without esophagitis: Secondary | ICD-10-CM

## 2017-01-27 DIAGNOSIS — F419 Anxiety disorder, unspecified: Secondary | ICD-10-CM | POA: Insufficient documentation

## 2017-01-27 DIAGNOSIS — R109 Unspecified abdominal pain: Secondary | ICD-10-CM

## 2017-01-27 MED ORDER — CLONAZEPAM 1 MG OR TABS
1.00 mg | ORAL_TABLET | Freq: Every day | ORAL | 4 refills | Status: AC
Start: 2016-12-26 — End: ?

## 2017-01-27 MED ORDER — CITALOPRAM HYDROBROMIDE 40 MG OR TABS
40.00 mg | ORAL_TABLET | Freq: Every evening | ORAL | 6 refills | Status: AC
Start: 2017-01-17 — End: ?

## 2017-01-27 MED ORDER — SIMVASTATIN 20 MG OR TABS
20.00 mg | ORAL_TABLET | Freq: Every evening | ORAL | 0 refills | Status: AC
Start: 2017-01-10 — End: ?

## 2017-01-27 MED ORDER — GABAPENTIN 800 MG OR TABS
1600.00 mg | ORAL_TABLET | Freq: Two times a day (BID) | ORAL | 6 refills | Status: AC
Start: 2016-11-19 — End: ?

## 2017-01-27 MED ORDER — VENTOLIN HFA 108 (90 BASE) MCG/ACT IN AERS
INHALATION_SPRAY | RESPIRATORY_TRACT | 1 refills | Status: AC
Start: 2017-01-17 — End: ?

## 2017-01-27 MED ORDER — LOPERAMIDE HCL 2 MG OR CAPS
2.0000 mg | ORAL_CAPSULE | Freq: Three times a day (TID) | ORAL | 0 refills | Status: DC | PRN
Start: 2017-01-27 — End: 2017-01-27

## 2017-01-27 MED ORDER — PANTOPRAZOLE SODIUM 40 MG OR TBEC: 40.00 mg | DELAYED_RELEASE_TABLET | Freq: Every day | ORAL | Status: AC

## 2017-01-27 MED ORDER — DICYCLOMINE HCL 10 MG OR CAPS
10.00 mg | ORAL_CAPSULE | Freq: Three times a day (TID) | ORAL | 3 refills | Status: AC
Start: 2017-01-27 — End: ?

## 2017-01-27 MED ORDER — ESTRING 2 MG VA RING
VAGINAL_RING | VAGINAL | 2 refills | Status: AC
Start: 2017-01-19 — End: ?

## 2017-01-27 MED ORDER — DICYCLOMINE HCL 10 MG OR CAPS
10.0000 mg | ORAL_CAPSULE | Freq: Three times a day (TID) | ORAL | 0 refills | Status: DC | PRN
Start: 2017-01-27 — End: 2017-01-27

## 2017-01-27 MED ORDER — FLUTICASONE-SALMETEROL 230-21 MCG/ACT IN AERO: 2.00 | INHALATION_SPRAY | Freq: Two times a day (BID) | RESPIRATORY_TRACT | Status: AC

## 2017-01-27 MED ORDER — LOPERAMIDE HCL 2 MG OR TABS
2.00 mg | ORAL_TABLET | Freq: Three times a day (TID) | ORAL | 3 refills | Status: AC | PRN
Start: 2017-01-27 — End: ?

## 2017-01-27 MED ORDER — BUTALBITAL-APAP-CAFFEINE 50-325-40 MG OR TABS
ORAL_TABLET | ORAL | 0 refills | Status: AC
Start: 2016-11-03 — End: ?

## 2017-01-27 MED ORDER — BUPROPION SR (BID) 200 MG OR TB12
ORAL_TABLET | ORAL | 5 refills | Status: AC
Start: 2017-01-17 — End: ?

## 2017-01-27 MED ORDER — MONTELUKAST SODIUM 10 MG OR TABS
10.00 mg | ORAL_TABLET | Freq: Every day | ORAL | 1 refills | Status: AC
Start: 2017-01-09 — End: ?

## 2017-01-27 NOTE — Progress Notes (Signed)
GASTROENTEROLOGY      Consultation Note        Carmelina Peal, MD    [place]  [place] [address]  [phone]    January 27, 2017      Ana Torres,   DOB:  Mar 11, 1971      REFERRING:   No Pcp, Per Patient      REASON FOR VISIT: URGENT APPOINTMENT    HISTORY OF PRESENT ILLNESS:     I had the pleasure to see this pleasant 46 year old female with many years history of change in bowel habits, characterized by frequent bouts of diarrhea, apparently associated with episodes of stress. Also complaining of frequent GERD, with nocturnal symptoms, relieved with the use of Pantoprazole.    Seen by a gastroenterologist 3 years ago, who performed several test, including hydrogen and Urea breath test; told by the nurse her numbers went high with Lactulose, unfortunately patient never went back for results.    Now for the past 3 days, complaining of persistent RUQ abdominal pain, associated with upper abdominal distention, nausea and severe diarrhea; symptoms began after eating raw fish for 2 days, seen at [facility] yesterday and  Abdominal ultrasound was positive for enlarged liver at 16.5 and CBD 7 mm, LABS: AST 31, otherwise NL CMP, CBC and Lipase, patient obtained an urgent appointment.  She denies fever, chills, vomiting or rectal bleeding.    Medical records reviewed.            Review of Systems: A 12 point review of systems was performed and reviewed.     Constitutional: No weight loss, fever, chills, weakness or fatigue.   HEENT: Eyes: No visual loss, blurred vision, doubles vision or yellow sclera. Ears, Nose, Throat: No hearing loss, sneezing, congestion, runny nose or sore throat.   Skin: No rash or itching.   Cardiovascular:  No chest pain, chest pressure or chest discomfort. No palpitations or edema.   Respiratory: No shortness of breath, cough or sputum.   Gastrointestinal: SEE HPI  Genitourinary: No burning on urination.    Neurological: No headache, dizziness, syncope, paralysis, ataxia, numbness or tingling in the extremities. No change in bowel or bladder control.   Musculoskeletal: No muscle, back pain, joint pain or stiffness.   Hematologic: No anemia, bleeding or bruising.   Lymphatics: No enlarged nodes. No history of splenectomy.   Psychiatric: No history of depression or anxiety.  Endocrinologic: No reports of sweating, cold or heat intolerance. No polyuria or polydipsia.    ALLERGIES:  Allergies   Allergen Reactions    Other Anaphylaxis       CURRENT MEDS:    Current Outpatient Prescriptions:     buPROPion (WELLBUTRIN SR) 200 MG Sustained-Release tablet, TAKE 1 TABLET BY MOUTH IN THE MORNING, Disp: , Rfl: 5    butalbital-acetaminophen-caffeine (FIORICET) 50-325-40 MG tablet, TAKE 1 TABLET BY MOUTH ONCE DAILY AS NEEDED FOR MIGRAINE, Disp: , Rfl: 0    citalopram (CELEXA) 40 MG tablet, Take 40 mg by mouth nightly., Disp: , Rfl: 6    clonazePAM (KLONOPIN) 1 MG tablet, Take 1 mg by mouth daily., Disp: , Rfl: 4    dicyclomine (BENTYL) 10 MG capsule, Take 1 capsule (10 mg) by mouth 3 times daily as needed (PAIN)., Disp: 60 capsule, Rfl: 0    ESTRING 2 MG vaginal ring, INSERT 1 RING PER VAGINA AS DIRECTED, Disp: , Rfl: 2    fluticasone-salmeterol (ADVAIR HFA) 230-21  MCG/ACT inhaler, Inhale 2 puffs by mouth 2 times daily., Disp: , Rfl:     gabapentin (NEURONTIN) 800 MG tablet, Take 1,600 mg by mouth 2 times daily., Disp: , Rfl: 6    loperamide (IMODIUM) 2 MG capsule, Take 1 capsule (2 mg) by mouth 3 times daily as needed for Diarrhea., Disp: 30 capsule, Rfl: 0    montelukast (SINGULAIR) 10 MG tablet, Take 10 mg by mouth daily., Disp: , Rfl: 1    pantoprazole (PROTONIX) 40 MG tablet, Take 40 mg by mouth daily., Disp: , Rfl:     simvastatin (ZOCOR) 20 MG tablet, Take 20 mg by mouth nightly., Disp: , Rfl: 0    VENTOLIN HFA 108 (90 Base) MCG/ACT inhaler, INHALE 2 PUFFS BY MOUTH EVERY 6 HOURS AS NEEDED, Disp: , Rfl: 1     PAST MEDICAL HISTORY:  Past Medical History:   Diagnosis Date    Anxiety     Asthma     GERD (gastroesophageal reflux disease)     High cholesterol     Migraine     Multiple allergies        PAST SURGICAL HISTORY:  Past Surgical History:   Procedure Laterality Date    catheter arrhythmia         SOCIAL HISTORY:  Social History   Substance Use Topics    Smoking status: Never Smoker    Smokeless tobacco: Never Used    Alcohol use Yes      Comment: ocassional       FAMILY HISTORY:  No family history of GI malignancies or liver disease  No family history of IBD  Family History   Problem Relation Age of Onset    Cholesterol/Lipid Disorder Mother     Hypertension Father     Cancer Father      TONGUE    Colon Cancer Maternal Grandmother     Heart Attack Maternal Grandmother     Hypertension Maternal Grandmother     Asthma Maternal Grandmother        PHYSICAL EXAM:  Vital signs:   Vitals:    01/27/17 1529   BP: 120/86   BP Location: Right arm   BP Patient Position: Sitting   BP cuff size: Regular   Pulse: 95   Resp: 16   Temp: 98.2 F (36.8 C)   TempSrc: Oral   Weight: 76 kg (167 lb 8.8 oz)   Height: 5' 4"  (1.626 m)    Body mass index is 28.76 kg/(m^2).      GEN:A&O X3,  well appearing, seated in exam room speaking in full sentences  HEENT: normocephalic/atraumatic, anicteric sclera, moist mucous membranes, no oral lesions or thrush  NECK: supple, no lymphadenopathy  CV: S1S2, regular rate and rhythm, no murmurs/rubs/gallops  PULM: clear to auscultation bilaterally, good inspiratory effort, no clear crackles or rales  ABD: soft, right sided tenderness, non-distended, normal bowel sounds, no rebound or guarding, enlarged tender liver  EXT: No cyanosis, clubbing, or edema.  SKIN: no petechiae or rash, complete skin exam not performed    RECENT LAB DATA:      No results found for this or any previous visit (from the past 1008 hour(s)).           Ana Torres was seen today for new patient, abdominal pain, diarrhea and bloating.    Diagnoses and all orders for this visit:    Hepatomegaly  -     Comprehensive Metabolic Panel Green; Future  -  CBC w/ Diff Lavender; Future  -     C-Reactive Protein, Blood Green Plasma Separator Tube; Future  -     Lipase, Blood Green Plasma Separator Tube; Future  -     Gliadin Peptide Antibodies, IgA & IgG Yellow serum separator tube; Future  -     Tissue Transglutaminase Antibody, IgA Yellow serum separator tube; Future  -     Tissue Transglutaminase IgG - See Instructions; Future  -     Sedimentation Rate (ESR), Blood Lavender; Future  -     Ferritin, Blood Green Plasma Separator Tube; Future  -     Stool Culture [employer]  -     Giardia lamblia Ag, EIA; Future  -     Stool Immunochemical Occult Blood; Future  -     Standard Ova And Parasite Exam; Future  -     Calprotectin, Fecal; Future  -     Saccharomyces cerevisiae IgG+IgA; Future  -     ANCA (Anti-Neutrophil Cytoplasmic Ab) [place]; Future  -     5-Nucleotidase, Blood Yellow serum separator tube; Future  -     Gamma Glutamyl Transferase, Blood Green Plasma Separator Tube; Future  -     ANA (Anti-Nuclear Antibody) [place]; Future  -     Anti-Smooth Muscle Antibody [place]; Future  -     Hepatitis C RNA, Quant Blood - See Instructions; Future  -     Hepatitis B Core Antibody Total Yellow serum separator tube; Future  -     Hepatitis B Surface Ab, Blood; Future  -     Hepatitis B Surface AG w/ Reflex to Confirm (Refl) - [facility]; Future  -     Hemochromatosis Lavender; Future  -     CT Abdomen And Pelvis WO/W Contrast; Future  -     Comprehensive Metabolic Panel Green  -     CBC w/ Diff [place]  -     C-Reactive Protein, Blood Green Plasma Separator Tube  -     Lipase, Blood Green Plasma Separator Tube  -     Gliadin Peptide Antibodies, IgA & IgG Yellow serum separator tube  -     Tissue Transglutaminase Antibody, IgA Yellow serum separator tube   -     Tissue Transglutaminase IgG - See Instructions  -     Sedimentation Rate (ESR), Blood Lavender  -     Ferritin, Blood Green Plasma Separator Tube  -     Giardia lamblia Ag, EIA  -     Stool Immunochemical Occult Blood  -     Standard Ova And Parasite Exam  -     Calprotectin, Fecal  -     ANCA (Anti-Neutrophil Cytoplasmic Ab) [place]  -     Gamma Glutamyl Transferase, Blood Green Plasma Separator Tube  -     ANA (Anti-Nuclear Antibody) [place]  -     Anti-Smooth Muscle Antibody [place]  -     Hepatitis C RNA, Quant Blood - See Instructions  -     Hepatitis B Core Antibody Total Yellow serum separator tube  -     Hepatitis B Surface Ab, Blood  -     Hepatitis B Surface AG w/ Reflex to Confirm (Refl) - [facility]  -     Hemochromatosis Lavender    Right sided abdominal pain  -     Comprehensive Metabolic Panel Green; Future  -  CBC w/ Diff [place]; Future  -     C-Reactive Protein, Blood Green Plasma Separator Tube; Future  -     Lipase, Blood Green Plasma Separator Tube; Future  -     Gliadin Peptide Antibodies, IgA & IgG Yellow serum separator tube; Future  -     Tissue Transglutaminase Antibody, IgA Yellow serum separator tube; Future  -     Tissue Transglutaminase IgG - See Instructions; Future  -     Sedimentation Rate (ESR), Blood Lavender; Future  -     Ferritin, Blood Green Plasma Separator Tube; Future  -     Stool Culture [employer]  -     Giardia lamblia Ag, EIA; Future  -     Stool Immunochemical Occult Blood; Future  -     Standard Ova And Parasite Exam; Future  -     Calprotectin, Fecal; Future  -     Saccharomyces cerevisiae IgG+IgA; Future  -     ANCA (Anti-Neutrophil Cytoplasmic Ab) [place]; Future  -     5-Nucleotidase, Blood Yellow serum separator tube; Future  -     Gamma Glutamyl Transferase, Blood Green Plasma Separator Tube; Future  -     ANA (Anti-Nuclear Antibody) [place]; Future  -     Anti-Smooth Muscle Antibody [place]; Future   -     Hepatitis C RNA, Quant Blood - See Instructions; Future  -     Hepatitis B Core Antibody Total Yellow serum separator tube; Future  -     Hepatitis B Surface Ab, Blood; Future  -     Hepatitis B Surface AG w/ Reflex to Confirm (Refl) - [facility]; Future  -     Hemochromatosis Lavender; Future  -     CT Abdomen And Pelvis WO/W Contrast; Future  -     Hepatitis B Core Antibody (IgM); Future  -     Hepatitis A IgM Antibody, Blood; Future  -     Comprehensive Metabolic Panel Green  -     CBC w/ Diff [place]  -     C-Reactive Protein, Blood Green Plasma Separator Tube  -     Lipase, Blood Green Plasma Separator Tube  -     Gliadin Peptide Antibodies, IgA & IgG Yellow serum separator tube  -     Tissue Transglutaminase Antibody, IgA Yellow serum separator tube  -     Tissue Transglutaminase IgG - See Instructions  -     Sedimentation Rate (ESR), Blood Lavender  -     Ferritin, Blood Green Plasma Separator Tube  -     Giardia lamblia Ag, EIA  -     Stool Immunochemical Occult Blood  -     Standard Ova And Parasite Exam  -     Calprotectin, Fecal  -     ANCA (Anti-Neutrophil Cytoplasmic Ab) [place]  -     Gamma Glutamyl Transferase, Blood Green Plasma Separator Tube  -     ANA (Anti-Nuclear Antibody) [place]  -     Anti-Smooth Muscle Antibody [place]  -     Hepatitis C RNA, Quant Blood - See Instructions  -     Hepatitis B Core Antibody Total Yellow serum separator tube  -     Hepatitis B Surface Ab, Blood  -     Hepatitis B Surface AG w/ Reflex to Confirm (Refl) - [facility]  -  Hemochromatosis [place]  -     Hepatitis B Core Antibody (IgM)  -     Hepatitis A IgM Antibody, Blood    Gastroesophageal reflux disease, esophagitis presence not specified    High cholesterol    Anxiety    Acute diarrhea  -     C. Diff PCR w/Reflex, Stool; Future    Change in bowel habits    Abnormal liver function test  -     Comprehensive Metabolic Panel Green; Future  -     CBC w/ Diff [place]; Future   -     C-Reactive Protein, Blood Green Plasma Separator Tube; Future  -     Lipase, Blood Green Plasma Separator Tube; Future  -     Gliadin Peptide Antibodies, IgA & IgG Yellow serum separator tube; Future  -     Tissue Transglutaminase Antibody, IgA Yellow serum separator tube; Future  -     Tissue Transglutaminase IgG - See Instructions; Future  -     Sedimentation Rate (ESR), Blood [place]; Future  -     Ferritin, Blood Green Plasma Separator Tube; Future  -     Stool Culture [employer]  -     Giardia lamblia Ag, EIA; Future  -     Stool Immunochemical Occult Blood; Future  -     Standard Ova And Parasite Exam; Future  -     Calprotectin, Fecal; Future  -     Saccharomyces cerevisiae IgG+IgA; Future  -     ANCA (Anti-Neutrophil Cytoplasmic Ab) [place]; Future  -     5-Nucleotidase, Blood Yellow serum separator tube; Future  -     Gamma Glutamyl Transferase, Blood Green Plasma Separator Tube; Future  -     ANA (Anti-Nuclear Antibody) [place]; Future  -     Anti-Smooth Muscle Antibody [place]; Future  -     Hepatitis C RNA, Quant Blood - See Instructions; Future  -     Hepatitis B Core Antibody Total Yellow serum separator tube; Future  -     Hepatitis B Surface Ab, Blood; Future  -     Hepatitis B Surface AG w/ Reflex to Confirm (Refl) - [facility]; Future  -     Hemochromatosis Lavender; Future  -     Comprehensive Metabolic Panel Green  -     CBC w/ Diff [place]  -     C-Reactive Protein, Blood Green Plasma Separator Tube  -     Lipase, Blood Green Plasma Separator Tube  -     Gliadin Peptide Antibodies, IgA & IgG Yellow serum separator tube  -     Tissue Transglutaminase Antibody, IgA Yellow serum separator tube  -     Tissue Transglutaminase IgG - See Instructions  -     Sedimentation Rate (ESR), Blood [place]  -     Ferritin, Blood Green Plasma Separator Tube  -     Giardia lamblia Ag, EIA  -     Stool Immunochemical Occult Blood  -     Standard Ova And Parasite Exam   -     Calprotectin, Fecal  -     ANCA (Anti-Neutrophil Cytoplasmic Ab) [place]  -     Gamma Glutamyl Transferase, Blood Green Plasma Separator Tube  -     ANA (Anti-Nuclear Antibody) [place]  -     Anti-Smooth Muscle Antibody [place]  -     Hepatitis C  RNA, Quant Blood - See Instructions  -     Hepatitis B Core Antibody Total Yellow serum separator tube  -     Hepatitis B Surface Ab, Blood  -     Hepatitis B Surface AG w/ Reflex to Confirm (Refl) - [facility]  -     Hemochromatosis [place]    Other orders  -     dicyclomine (BENTYL) 10 MG capsule; Take 1 capsule (10 mg) by mouth 3 times daily as needed (PAIN).  -     loperamide (IMODIUM) 2 MG capsule; Take 1 capsule (2 mg) by mouth 3 times daily as needed for Diarrhea.            Assessment, diagnosis, plan of treatment were discussed with patient in great detail. Patient was encouraged to call our office or use Patient Portal EMR for any further questions or concerns.     Total amount of 60 minutes were spent obtaining the patient's medical history and completing physical examination, of which more than 50% was spent counseling.

## 2017-01-27 NOTE — Patient Instructions (Addendum)
1. Avoid dairy product, alcohol and raw fruits and vegetables until diarrhea is gone.    [facility]  [address], [address]  [place], [place] [id number]  PHN: [phone]  FAX: [phone]

## 2017-01-28 ENCOUNTER — Telehealth: Payer: Self-pay | Admitting: Gastroenterology

## 2017-01-28 NOTE — Telephone Encounter (Signed)
sPOKE TO PT AND INFORMED FO MSG PT UNDERSTOOD. MB

## 2017-01-28 NOTE — Telephone Encounter (Signed)
Lori from [facility] called to speak with [place]. She is stating they need to know this order for CT of Abdomen and Pelvic is cleared before scheduling patient. Put caller on hold to research and contact referral center but call dropped. Please forward to [place] to contact [place] [phone]

## 2017-01-28 NOTE — Telephone Encounter (Signed)
Patient calling to check on status. Advised that orders and referral were faxed. Thank you

## 2017-01-28 NOTE — Telephone Encounter (Signed)
VERONICA FROM [facility] CALLED TRANSFERRED CALL TO MARY TO FURTHER ASSIST

## 2017-01-28 NOTE — Telephone Encounter (Signed)
Patient called stating to please fax orders for CT Abdomen and Pelvic w an wo contrast  to [facility]. Patient did not have fax number but phone number is [phone] located at [address] [place], [place] [id number]. She would like to make an appt on Monday.     Call patient once faxed. Ok to leave detailed msg if no one answers

## 2017-01-28 NOTE — Telephone Encounter (Signed)
I relayed the msg to Dr Jason Filasaad and he states that the pt needs the blood work and ct done before he can prescribe any other medication. If pain get worse she need to go the her nearest er. mb

## 2017-01-28 NOTE — Telephone Encounter (Signed)
Patient states she saw Dr. Jason Filasaad yesterday 07/26 and the pain medication that he prescribes is not working because she is till in severe pain and does not want to go over the weekend with pain. Called clinic and was advised that Dr. Jason FilaSaad is in clinic w/ a pt right now but this will be brought his attention and the pt will receive a call back today. I advise the pt the above and she understood.

## 2017-01-30 ENCOUNTER — Other Ambulatory Visit: Payer: Self-pay | Admitting: Gastroenterology

## 2017-01-31 ENCOUNTER — Encounter: Payer: Self-pay | Admitting: Gastroenterology

## 2017-01-31 NOTE — Telephone Encounter (Signed)
Orders weere faxed to [facility]. mb

## 2017-01-31 NOTE — Telephone Encounter (Signed)
From: Trixie DeisKelly Tober  To: Blanchie DessertSaad, Yamilex Borgwardt, MD  Sent: 01/31/2017 12:34 PM PDT  Subject:  20-Other    Please  change my name on your files to Bobbye MortonKelley Finan-Savoca this is what my ins card says thx

## 2017-02-01 ENCOUNTER — Encounter: Payer: Self-pay | Admitting: Gastroenterology

## 2017-02-01 DIAGNOSIS — R7989 Other specified abnormal findings of blood chemistry: Secondary | ICD-10-CM

## 2017-02-01 NOTE — Telephone Encounter (Signed)
From: Trixie DeisKelly Barclift  To: Blanchie DessertSaad, Dartanian Knaggs, MD  Sent: 02/01/2017 5:35 PM PDT  Subject:  20-Other    Hi  Dr. Jason FilaSaad,    Once  all of my test results are in will you give me a diagnosis over the phone or do I have to wait until my 8/8 appointment with you to find out? Just a little nervous. Thank you so much for being so thorough.

## 2017-02-03 NOTE — Telephone Encounter (Signed)
Hi Ana Torres,    I am ordering new blood test for this patient. Please cancel her appointment for the 8th, and contact the patient to know how to get her the request form, and please red flag me to call her with the results.  Also, please send her a release form for records to Dr. Donalee CitrinAtay ( [facility]    Thanks,

## 2017-02-04 ENCOUNTER — Encounter: Payer: Self-pay | Admitting: Gastroenterology

## 2017-02-04 LAB — COMPREHENSIVE METABOLIC PANEL, BLOOD
ALT (SGPT): 54 U/L — ABNORMAL HIGH (ref 6–29)
AST (SGOT): 41 U/L — ABNORMAL HIGH (ref 10–35)
Albumin/Glob Ratio: 1.5 (calc) (ref 1.0–2.5)
Albumin: 4.4 g/dL (ref 3.6–5.1)
Alkaline Phos: 193 U/L — ABNORMAL HIGH (ref 33–115)
BUN: 12 mg/dL (ref 7–25)
Bilirubin, Total: 0.5 mg/dL (ref 0.2–1.2)
Calcium: 9.9 mg/dL (ref 8.6–10.2)
Carbon Dioxide: 25 mmol/L (ref 20–31)
Chloride: 103 mmol/L (ref 98–110)
Creatinine: 0.94 mg/dL (ref 0.50–1.10)
Globulin: 2.9 g/dL (calc) (ref 1.9–3.7)
Glucose: 103 mg/dL — ABNORMAL HIGH (ref 65–99)
Potassium: 4.1 mmol/L (ref 3.5–5.3)
Sodium: 140 mmol/L (ref 135–146)
Total Protein: 7.3 g/dL (ref 6.1–8.1)
eGFR African American: 85 mL/min/{1.73_m2} (ref >=60)
eGFR non-Afr.American: 73 mL/min/{1.73_m2} (ref >=60)

## 2017-02-04 LAB — SED RATE, BLOOD: Sed Rate: 22 mm/h — ABNORMAL HIGH (ref <=20)

## 2017-02-04 LAB — CBC WITH DIFF, BLOOD
Abs Basophils: 40 cells/uL (ref 0–200)
Abs Eosinophils: 177 cells/uL (ref 15–500)
Abs Lymphs: 1585 cells/uL (ref 850–3900)
Abs Monocytes: 507 cells/uL (ref 200–950)
Abs NRBC: 0 cells/uL
Abs Neutrophils: 3392 cells/uL (ref 1500–7800)
Basophils: 0.7 %
Eosinophils: 3.1 %
HCT: 39.1 % (ref 35.0–45.0)
HGB: 13.1 g/dL (ref 11.7–15.5)
Lymps: 27.8 %
MCH: 29.5 pg (ref 27.0–33.0)
MCHC: 33.5 g/dL (ref 32.0–36.0)
MCV: 88.1 fL (ref 80.0–100.0)
MPV: 9.4 fL (ref 7.5–12.5)
Monocytes: 8.9 %
PLT: 339 10*3/uL (ref 140–400)
RBC: 4.44 10*6/uL (ref 3.80–5.10)
RDW: 13 % (ref 11.0–15.0)
SEGS: 59.5 %
WBC: 5.7 10*3/uL (ref 3.8–10.8)

## 2017-02-04 LAB — HEPATITIS B SURFACE AG W/ REFLEX CONFIRM (REFL) - QUEST: Hepatitis B Surface Antigen Refl: NONREACTIVE

## 2017-02-04 LAB — ANA (ANTI-NUCLEAR AB), BLOOD
ANA Screen, IFA: POSITIVE — AB
ANA Titer: 1:40 {titer} — ABNORMAL HIGH

## 2017-02-04 LAB — HEPATITIS C RNA, QUANT BLOOD
HCV RNA Quant Real Time PCR: <15 IU/mL
Hcv Rna, Quantitative Real Time Pcr: <1.18 Log IU/mL

## 2017-02-04 LAB — STANDARD O&P

## 2017-02-04 LAB — GLIADIN PEPTIDE AB, IGA & IGG
Gliadin (Deamidated) Ab (IgA): 4 U (ref <20)
Gliadin (Deamidated) Ab (IgG): 2 U (ref <20)

## 2017-02-04 LAB — TISSUE TRANSGLUTAMINASE IGG: Tissue Transglutaminase Ab, IgG: <1 U/mL

## 2017-02-04 LAB — LIPASE, BLOOD: Lipase: 32 U/L (ref 7–60)

## 2017-02-04 LAB — FERRITIN, BLOOD: Ferritin: 58 ng/mL (ref 10–232)

## 2017-02-04 LAB — HEPATITIS A IGM ANTIBODY, BLOOD: Hepatitis A IGM: NONREACTIVE

## 2017-02-04 LAB — GIARDIA LAMBLIA AG, EIA

## 2017-02-04 LAB — HEMOCHROMATOSIS

## 2017-02-04 LAB — HEPATITIS B CORE IGM ANTIBODY, BLOOD: Hepatits B Core Ab (IgM): NONREACTIVE

## 2017-02-04 LAB — CALPROTECTIN, FECAL: Calprotectin, Stool: 130.6 mcg/g (ref <=162.9)

## 2017-02-04 LAB — TISSUE TRANSGLUTAMINASE ANTIBODY, IGA, BLOOD: Tissue Transglutaminase AB, IgA: <1 U/mL

## 2017-02-04 LAB — HEPATITIS B CORE AB TOTAL: Hepatitis B Core AB Total: NONREACTIVE

## 2017-02-04 LAB — ANTI-NEUTRO CYTOPLASMA AB, BLOOD
Myeloperoxidase Antibody: <1 AI
Proteinase-3: <1 AI

## 2017-02-04 LAB — GAMMA GLUTAMYL TRANSFERASE, BLOOD: GGT: 198 U/L — ABNORMAL HIGH (ref 3–55)

## 2017-02-04 LAB — C-REACTIVE PROTEIN, BLOOD: CRP: 18.9 mg/L — ABNORMAL HIGH (ref <8.0)

## 2017-02-04 LAB — ANTI-SMOOTH MUSCLE AB, BLOOD: Smooth Muscle Ab Screen: NEGATIVE

## 2017-02-04 LAB — HEPATITIS B SURFACE AB, BLOOD: Hepatitis B Surface Ab: REACTIVE — AB

## 2017-02-05 ENCOUNTER — Encounter: Payer: Self-pay | Admitting: Gastroenterology

## 2017-02-05 NOTE — Telephone Encounter (Signed)
From: Trixie DeisKelly Lamoreaux  To: Blanchie DessertSaad, Taige Housman, MD  Sent: 01/31/2017 12:01 PM PDT  Subject:  20-Other    My  [place]. [place]. I've done all my labs. Dropping off Stool samples at Lab today. All blood work done and have my CT scan today at 4 pm.

## 2017-02-06 ENCOUNTER — Encounter: Payer: Self-pay | Admitting: Gastroenterology

## 2017-02-06 NOTE — Telephone Encounter (Signed)
From: Trixie DeisKelly Pereda  To: Blanchie DessertSaad, Astha Probasco, MD  Sent: 02/05/2017 11:38 PM PDT  Subject:  1-Non Urgent Medical Advice    I  have a question about COMPREHENSIVE METABOLIC PANEL, BLOOD resulted on 02/04/17, 6:17 AM.    Does  this mean I'm prediabetic? Also what does this tell you about my liver?

## 2017-02-07 ENCOUNTER — Encounter: Payer: Self-pay | Admitting: Gastroenterology

## 2017-02-07 NOTE — Telephone Encounter (Signed)
From: Trixie DeisKelly Bramblett  To: Blanchie DessertSaad, Shalena Ezzell, MD  Sent: 02/07/2017 12:35 PM PDT  Subject:  20-Other    Hi  Dr. Jason FilaSaad,    Since  one or more of my medications may be annoying my liver I thought I should let you know what types of OTC medication I take at least once a week:    Excedrine  (but I have only taken tylenol since this incident happened)  Benadryl  Mucinex  tagament  imodium

## 2017-02-08 ENCOUNTER — Ambulatory Visit: Payer: Medicare HMO | Admitting: Neurology

## 2017-02-09 ENCOUNTER — Ambulatory Visit: Payer: BC Managed Care – PPO | Admitting: Gastroenterology

## 2017-02-09 LAB — STOOL IMMUNOCHEMICAL OCCULT BLOOD

## 2017-02-10 ENCOUNTER — Encounter: Payer: Self-pay | Admitting: Gastroenterology

## 2017-02-10 ENCOUNTER — Telehealth: Payer: Self-pay | Admitting: Gastroenterology

## 2017-02-10 NOTE — Telephone Encounter (Signed)
PATIENT WOULD LIKE TO KNOW IF LABS ORDERED BY MD NEED TO BE FASTING.    PLEASE ASSIST

## 2017-02-10 NOTE — Telephone Encounter (Signed)
Patient already contacted me, being upset because her question was not answered as swiftly as expected.    Told there was no need to fast.

## 2017-02-10 NOTE — Telephone Encounter (Signed)
Dr saad pls advise if labs should be fasting?

## 2017-02-11 ENCOUNTER — Encounter: Payer: Self-pay | Admitting: Gastroenterology

## 2017-02-11 NOTE — Telephone Encounter (Signed)
From: Trixie DeisKelly Markoff  To: Blanchie DessertSaad, Zain Lankford, MD  Sent: 02/11/2017 11:54 AM PDT  Subject:  1-Non Urgent Medical Advice    I  sent another message bf the exercise questions about my symptoms.

## 2017-02-11 NOTE — Telephone Encounter (Signed)
From: Trixie DeisKelly Robison  To: Blanchie DessertSaad, Jacqualyn Sedgwick, MD  Sent: 02/10/2017 10:38 PM PDT  Subject:  20-Other    Is  it ok to walk on my treadmill and get some exercise?

## 2017-02-12 ENCOUNTER — Encounter: Payer: Self-pay | Admitting: Gastroenterology

## 2017-02-12 ENCOUNTER — Other Ambulatory Visit: Payer: Self-pay | Admitting: Gastroenterology

## 2017-02-14 NOTE — Telephone Encounter (Signed)
From: Trixie DeisKelly Hundertmark  To: Blanchie DessertSaad, Cainan Trull, MD  Sent: 02/12/2017 12:10 PM PDT  Subject:  20-Other    I  got my [facility] this morning. [facility] [address] [place] [phone]

## 2017-02-15 LAB — GAMMA GLUTAMYL TRANSFERASE, BLOOD: GGT: 50 U/L (ref 3–55)

## 2017-02-15 LAB — COMPREHENSIVE METABOLIC PANEL, BLOOD
ALT (SGPT): 10 U/L (ref 6–29)
AST (SGOT): 12 U/L (ref 10–35)
Albumin/Glob Ratio: 1.6 (calc) (ref 1.0–2.5)
Albumin: 4.2 g/dL (ref 3.6–5.1)
Alkaline Phos: 69 U/L (ref 33–115)
BUN: 11 mg/dL (ref 7–25)
Bilirubin, Total: 0.4 mg/dL (ref 0.2–1.2)
Calcium: 10 mg/dL (ref 8.6–10.2)
Carbon Dioxide: 29 mmol/L (ref 20–32)
Chloride: 106 mmol/L (ref 98–110)
Creatinine: 0.89 mg/dL (ref 0.50–1.10)
Globulin: 2.6 g/dL (calc) (ref 1.9–3.7)
Glucose: 102 mg/dL — ABNORMAL HIGH (ref 65–99)
Potassium: 4.6 mmol/L (ref 3.5–5.3)
Sodium: 142 mmol/L (ref 135–146)
Total Protein: 6.8 g/dL (ref 6.1–8.1)
eGFR African American: 91 mL/min/{1.73_m2} (ref >=60)
eGFR non-Afr.American: 78 mL/min/{1.73_m2} (ref >=60)

## 2017-02-15 LAB — CERULOPLASMIN: Ceruloplasmin: 27 mg/dL (ref 18–53)

## 2017-02-16 LAB — 5'NUCLEOTIDASE - QUEST: 5'Nucleotidase: 5 U/L (ref 0–10)

## 2017-03-01 ENCOUNTER — Ambulatory Visit (INDEPENDENT_AMBULATORY_CARE_PROVIDER_SITE_OTHER): Payer: Medicare HMO | Admitting: Neurology

## 2017-03-01 ENCOUNTER — Encounter: Payer: Self-pay | Admitting: Neurology

## 2017-03-01 VITALS — BP 108/70 | HR 106 | Ht 63.0 in | Wt 225.3 lb

## 2017-03-01 DIAGNOSIS — R410 Disorientation, unspecified: Secondary | ICD-10-CM

## 2017-03-01 DIAGNOSIS — F329 Major depressive disorder, single episode, unspecified: Secondary | ICD-10-CM

## 2017-03-01 DIAGNOSIS — R51 Headache: Secondary | ICD-10-CM

## 2017-03-01 DIAGNOSIS — R519 Headache, unspecified: Secondary | ICD-10-CM

## 2017-03-01 DIAGNOSIS — F419 Anxiety disorder, unspecified: Secondary | ICD-10-CM

## 2017-03-01 MED ORDER — TOPIRAMATE 50 MG PO TABS: 50.0000 mg | ORAL_TABLET | Freq: Every day | ORAL | 2 refills | Status: DC

## 2017-03-01 NOTE — Patient Instructions (Signed)
  1.  We will start topiramate (Topamax) 50mg  at bedtime.  Possible side effects include: impaired thinking, sedation, paresthesias (numbness and tingling) and weight loss.  It may cause dehydration and there is a small risk for kidney stones, so make sure to stay hydrated with water during the day.  There is also a very small risk for glaucoma, so if you notice any change in your vision while taking this medication, see an ophthalmologist.   Call in 4 weeks with update and we can adjust dose if needed.  2.  Stop Vicodin.  Ideally, we don't recommend opioids for treating headache.  Take ibuprofen, naproxen or Excedrin at earliest onset of headache. 3.  Limit use of pain relievers to no more than 2 days out of the week.  These medications include acetaminophen, ibuprofen, triptans and narcotics.  This will help reduce risk of rebound headaches. 4.  Be aware of common food triggers such as processed sweets, processed foods with nitrites (such as deli meat, hot dogs, sausages), foods with MSG, alcohol (such as wine), chocolate, certain cheeses, certain fruits (dried fruits, some citrus fruit), vinegar, diet soda. 4.  Avoid caffeine 5.  Routine exercise 6.  Proper sleep hygiene 7.  Stay adequately hydrated with water 8.  Keep a headache diary. 9.  Maintain proper stress management. 10.  Do not skip meals. 11.  Consider supplements:  Magnesium citrate 400mg  to 600mg  daily, riboflavin 400mg , Coenzyme Q 10 100mg  three times daily 12.  Follow up in 3 months.

## 2017-03-01 NOTE — Progress Notes (Signed)
NEUROLOGY CONSULTATION NOTE  MEHREEN AZIZI MRN: [id number] DOB: 10-16-1970  Referring provider: Dr. Debara Pickett Primary care provider: Dr. Alyson Ingles  Reason for consult:  headache  HISTORY OF PRESENT ILLNESS: Lynn Molina is a 46 year old female with asthma, PTSD, depression and anxiety who presents for headache.  History supplemented by prior neurologist's note.  Onset:  2009, while working for [employer] when a hammer was dropped from the top of a roof and landing on her head.  CT of head at the time was reportedly unremarkable. Location:  Starts in right eye and radiates to entire head Quality:  Stabbing/burning in eye, squeezing in head Intensity:  Constant mild headache that fluctuates in severity to severe Aura:  no Prodrome:  no Postdrome:  no Associated symptoms:  Sometimes she has numbness in the hands and arms.  She does not have associated nausea, vomiting, photophobia, phonophobia or visual disturbance.  She has not had any new worse headache of her life, waking up from sleep Duration:  Severe fluctuations last 2 hours Frequency:  3 to 5 days a week. Frequency of abortive medication: 3 to 4 days a week Triggers/exacerbating factors:  no Relieving factors:  no Activity:  Aggravates but functions.  Past NSAIDS:  no Past analgesics:  no Past abortive triptans:  no Past muscle relaxants:  no Past anti-emetic:  no Past antihypertensive medications:  no Past antidepressant medications:  no Past anticonvulsant medications:  no Past vitamins/Herbal/Supplements:  no Other past therapies:  no  Abortive regimen:  First line- Vicodin, second line-extra Lyrica, third-line- sleep. Current NSAIDS:  no Current analgesics:  Hydrocodone-acetaminophen Current triptans:  no Current anti-emetic:  no Current muscle relaxants:  no Current anti-anxiolytic:  no Current sleep aide:  no Current Antihypertensive medications:  no Current Antidepressant medications:   no Current Anticonvulsant medications:  Lyrica 150mg  three times daily Current Antihistamines/Decongestants:  no Other therapy:  no  Caffeine:  Herbal tea Alcohol:  no Smoker:  no Diet:  Some water Exercise:  no Depression/anxiety:  yes Sleep hygiene:  Varies  In April, she had an episode of right sided chest pain associated with lightheadedness and confusion.  She was evaluated in the ED where MI was ruled out.  10/31/16 LABS: CBC with WBC 7.7, HGB 8.2, HCT 27.9, PLT 642; BMP with Na 137, K 3.6, Cl 103, CO2 25, glucose 105, BUN 11, Cr 0.83; hepatic panel with total bili 0.4, ALP 62, AST 21 and ALT 17.   PAST MEDICAL HISTORY: Past Medical History:  Diagnosis Date  . Anemia    hx  . Asthma    rarely uses inhaler  . Environmental allergies   . Eye pain, right   . GERD (gastroesophageal reflux disease)    occasional but diet controlled, no med  . Head injury    Hammer fell on head - tx w lyrica  . Headache    tx with lyrica  . Nerve damage    Head  . 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 SVD (spontaneous vaginal delivery)    x 2    PAST SURGICAL HISTORY: Past Surgical History:  Procedure Laterality Date  . DENTAL SURGERY     tooth ext with general anesthesia  . DILATATION & CURETTAGE/HYSTEROSCOPY WITH MYOSURE N/A 01/21/2016   Procedure: DILATATION & CURETTAGE/HYSTEROSCOPY WITH MYOSURE;  Surgeon: Aloha Gell, MD;  Location: [facility];  Service: Gynecology;  Laterality: N/A;  .  TUBAL LIGATION    . WISDOM TOOTH EXTRACTION      MEDICATIONS: Current Outpatient Prescriptions on File Prior to Visit  Medication Sig Dispense Refill  . acetaminophen (TYLENOL) 500 MG tablet Take 500 mg by mouth every 6 (six) hours as needed for mild pain, moderate pain or headache.     . albuterol (PROVENTIL HFA;VENTOLIN HFA) 108 (90 BASE) MCG/ACT inhaler Inhale 2 puffs into the lungs every 6 (six) hours as needed for wheezing or shortness of  breath.    . ciclesonide (ALVESCO) 160 MCG/ACT inhaler Inhale 1 puff into the lungs 2 (two) times daily.    . ferrous sulfate 220 (44 Fe) MG/5ML solution Take 660 mg by mouth daily with breakfast.     . HYDROcodone-acetaminophen (NORCO/VICODIN) 5-325 MG tablet Take 2 tablets by mouth every 6 (six) hours as needed. (Patient taking differently: Take 2 tablets by mouth every 6 (six) hours as needed for moderate pain or severe pain. ) 10 tablet 0  . loratadine (ALAVERT) 10 MG tablet Take 10 mg by mouth daily.    . pregabalin (LYRICA) 150 MG capsule Take 150 mg by mouth 3 (three) times daily.    . tranexamic acid (LYSTEDA) 650 MG TABS tablet Take 1,300 mg by mouth 3 (three) times daily.     No current facility-administered medications on file prior to visit.     ALLERGIES: Allergies  Allergen Reactions  . Tessalon [Benzonatate] Anaphylaxis and Shortness Of Breath    Swollen face, throat swelling up  . Zyrtec [Cetirizine Hcl] Nausea And Vomiting    FAMILY HISTORY: Family History  Problem Relation Age of Onset  . Breast cancer Mother   . Heart Problems Father   . Leukemia Brother   . Heart Problems Daughter   . Prostate cancer Paternal Grandfather   . Cancer Brother   . Diabetes Paternal Grandmother   . High blood pressure Paternal Grandmother   . Diabetes Maternal Grandmother   . High blood pressure Maternal Grandmother     SOCIAL HISTORY: Social History   Social History  . Marital status: Single    Spouse name: N/A  . Number of children: 2  . Years of education: Cosmetolog   Occupational History  .  Other    n/a   Social History Main Topics  . Smoking status: Never Smoker  . Smokeless tobacco: Never Used  . Alcohol use Yes     Comment: occasional wine  . Drug use: No  . Sexual activity: Yes    Birth control/ protection: Surgical     Comment: tubal ligation   Other Topics Concern  . Not on file   Social History Narrative   Patient lives at home with daughter.    Caffeine Use: once or twice weekly    REVIEW OF SYSTEMS: Constitutional: No fevers, chills, or sweats, no generalized fatigue, change in appetite Eyes: No visual changes, double vision, eye pain Ear, nose and throat: No hearing loss, ear pain, nasal congestion, sore throat Cardiovascular: No chest pain, palpitations Respiratory:  No shortness of breath at rest or with exertion, wheezes GastrointestinaI: No nausea, vomiting, diarrhea, abdominal pain, fecal incontinence Genitourinary:  No dysuria, urinary retention or frequency Musculoskeletal:  No neck pain, back pain Integumentary: No rash, pruritus, skin lesions Neurological: as above Psychiatric: No depression, insomnia, anxiety Endocrine: No palpitations, fatigue, diaphoresis, mood swings, change in appetite, change in weight, increased thirst Hematologic/Lymphatic:  No purpura, petechiae. Allergic/Immunologic: no itchy/runny eyes, nasal congestion, recent allergic reactions, rashes  PHYSICAL EXAM: Vitals:   03/01/17 1246  BP: 108/70  Pulse: (!) 106  SpO2: 94%   General: No acute distress.  Patient appears well-groomed.  Head:  Normocephalic/atraumatic Eyes:  fundi examined but not visualized Neck: supple, no paraspinal tenderness, full range of motion Back: No paraspinal tenderness Heart: regular rate and rhythm Lungs: Clear to auscultation bilaterally. Vascular: No carotid bruits. Neurological Exam: Mental status: alert and oriented to person, place, and time, recent and remote memory intact, fund of knowledge intact, attention and concentration intact, speech fluent and not dysarthric, language intact. Cranial nerves: CN I: not tested CN II: pupils equal, round and reactive to light, visual fields intact CN III, IV, VI:  full range of motion, no nystagmus, no ptosis CN V: facial sensation intact CN VII: upper and lower face symmetric CN VIII: hearing intact CN IX, X: gag intact, uvula midline CN XI:  sternocleidomastoid and trapezius muscles intact CN XII: tongue midline Bulk & Tone: normal, no fasciculations. Motor:  5/5 throughout  Sensation: temperature and vibration sensation intact. Deep Tendon Reflexes:  2+ throughout, toes downgoing.  Finger to nose testing:  Without dysmetria.  Heel to shin:  Without dysmetria.  Gait:  Normal station and stride.  Able to turn and tandem walk. Romberg negative.  IMPRESSION: Chronic daily headache Episode of chest pain and confusion.  Unclear, but I don't suspect migraine related or seizure. Depression and anxiety  PLAN: 1.  Start topiramate 50mg  at bedtime.  We can increase dose in 4 weeks if needed.  Side effects discussed. 2.  Stop Vicodin for abortive therapy and use OTC (ibuprofen, naproxen, Excedrin) 3.  Limit pain relievers to no more than 2 days out of the week 4.  Increase water intake, modify diet and exercise. 5.  She will continue Lyrica for now (as she says it helped) 6.  Follow up in 3 months.  Thank you for allowing me to take part in the care of this patient.  Metta Clines, DO  CC:  Ricke Hey, MD  Lyman Bishop, MD

## 2017-04-29 ENCOUNTER — Ambulatory Visit: Payer: Self-pay | Admitting: Gastroenterology

## 2017-05-04 ENCOUNTER — Ambulatory Visit: Payer: BC Managed Care – PPO | Admitting: Gastroenterology

## 2017-05-20 ENCOUNTER — Ambulatory Visit: Payer: BC Managed Care – PPO | Admitting: Gastroenterology

## 2017-06-03 ENCOUNTER — Encounter: Payer: Self-pay | Admitting: Gastroenterology

## 2017-06-03 ENCOUNTER — Ambulatory Visit: Payer: Self-pay | Admitting: Gastroenterology

## 2017-06-03 VITALS — BP 114/85 | HR 89 | Temp 98.5°F | Wt 176.4 lb

## 2017-06-03 DIAGNOSIS — R1012 Left upper quadrant pain: Secondary | ICD-10-CM

## 2017-06-03 DIAGNOSIS — R112 Nausea with vomiting, unspecified: Secondary | ICD-10-CM

## 2017-06-03 DIAGNOSIS — R1011 Right upper quadrant pain: Principal | ICD-10-CM

## 2017-06-03 DIAGNOSIS — K219 Gastro-esophageal reflux disease without esophagitis: Secondary | ICD-10-CM

## 2017-06-03 DIAGNOSIS — R194 Change in bowel habit: Secondary | ICD-10-CM

## 2017-06-03 MED ORDER — ESTRADIOL 0.05 MG/24HR TD PTWK
MEDICATED_PATCH | TRANSDERMAL | 4 refills | Status: AC
Start: 2017-05-07 — End: ?

## 2017-06-03 NOTE — Progress Notes (Signed)
GASTROENTEROLOGY  Follow Up Note      Blanchie DessertARLOS Ashlynn Gunnels, M.D. FAASLD  Clinical Assistant Professor of Medicine    [facility]  [place]  [address]  [place], [place] [id number]  Phone: [phone], Fax: [phone]  [email or web address]        06/03/2017    Ana Torres   DOB: 1971/06/20      REFERRING: Self, Referred  No address on file      REASON FOR VISIT: FOLLOW UP    HISTORY OF PRESENT ILLNESS:    01/27/17:  I had the pleasure to see this pleasant [age] year old female with many years history of change in bowel habits, characterized by frequent bouts of diarrhea, apparently associated with episodes of stress. Also complaining of frequent GERD, with nocturnal symptoms, relieved with the use of Pantoprazole.    Seen by a gastroenterologist 3 years ago, who performed several test, including hydrogen and Urea breath test; told by the nurse her numbers went high with Lactulose, unfortunately patient never went back for results.    Now for the past 3 days, complaining of persistent RUQ abdominal pain, associated with upper abdominal distention, nausea and severe diarrhea; symptoms began after eating raw fish for 2 days, seen at [facility] yesterday and  Abdominal ultrasound was positive for enlarged liver at 16.5 and CBD 7 mm, LABS: AST 31, otherwise NL CMP, CBC and Lipase, patient obtained an urgent appointment.  She denies fever, chills, vomiting or rectal bleeding.  Medical records reviewed.    06/03/17: During last visit, blood work up performed, showing abnormal liver test, mainly elevation of alkaline phosphatase, as well as elevated CRP, with resolution of liver test abnormalities, as well as resolution of symptoms during follow up \\blood  test.    Now with a 2-3 week history of alternating right upper abdomen and left upper abdomen pain, that lasts a few moments, not associated with physical  activity, food ingestion or change in bowel habits, relieved by itself, of mild to moderate intensity.  2 days ago, she developed postprandial nausea and postprandial vomiting, today no further vomiting, however with nausea and lack of appetite. She denies fever, chills, change in bowel habits or diffuse abdominal pain.  Also, complaining of chronic GERD, requiring daily use of medications as well as chronic intermittent diarrhea  Of significance, patient did not show for 3 appointments made by herself.        Review of Systems:    A 12 point review of systems was performed and reviewed.     Constitutional: No weight loss, fever, chills, weakness or fatigue.   HEENT: Eyes: No visual loss, blurred vision, doubles vision or yellow sclera. Ears, Nose, Throat: No hearing loss, sneezing, congestion, runny nose or sore throat.   Skin: No rash or itching.   Cardiovascular:  No chest pain, chest pressure or chest discomfort. No palpitations or edema.   Respiratory: No shortness of breath, cough or sputum.   Gastrointestinal: SEE HPI  Genitourinary: No burning on urination.   Neurological: No headache, dizziness, syncope, paralysis, ataxia, numbness or tingling in the extremities. No change in bowel or bladder control.   Musculoskeletal: No muscle, back pain, joint pain or stiffness.   Hematologic: No anemia, bleeding or bruising.   Lymphatics: No enlarged nodes. No history of splenectomy.   Psychiatric: No history of depression or anxiety.  Endocrinologic: No reports of sweating, cold or heat intolerance. No polyuria or polydipsia.  ALLERGIES:  Allergies   Allergen Reactions    Other Anaphylaxis       CURRENT MEDS:    Current Outpatient Prescriptions:     buPROPion (WELLBUTRIN SR) 200 MG Sustained-Release tablet, TAKE 1 TABLET BY MOUTH IN THE MORNING, Disp: , Rfl: 5    butalbital-acetaminophen-caffeine (FIORICET) 50-325-40 MG tablet, TAKE 1 TABLET BY MOUTH ONCE DAILY AS NEEDED FOR MIGRAINE, Disp: , Rfl: 0     citalopram (CELEXA) 40 MG tablet, Take 40 mg by mouth nightly., Disp: , Rfl: 6    clonazePAM (KLONOPIN) 1 MG tablet, Take 1 mg by mouth daily., Disp: , Rfl: 4    dicyclomine (BENTYL) 10 MG capsule, Take 1 capsule (10 mg) by mouth 3 times daily (before meals)., Disp: 90 capsule, Rfl: 3    estradiol (CLIMARA) 0.05 MG/24HR, APPLY 1 PATCH TOPICALLY WEEKLY AS DIRECTED, Disp: , Rfl: 4    ESTRING 2 MG vaginal ring, INSERT 1 RING PER VAGINA AS DIRECTED, Disp: , Rfl: 2    fluticasone-salmeterol (ADVAIR HFA) 230-21 MCG/ACT inhaler, Inhale 2 puffs by mouth 2 times daily., Disp: , Rfl:     gabapentin (NEURONTIN) 800 MG tablet, Take 1,600 mg by mouth 2 times daily., Disp: , Rfl: 6    loperamide (IMODIUM) 2 MG tablet, Take 1 tablet (2 mg) by mouth 3 times daily as needed for Diarrhea., Disp: 90 tablet, Rfl: 3    montelukast (SINGULAIR) 10 MG tablet, Take 10 mg by mouth daily., Disp: , Rfl: 1    pantoprazole (PROTONIX) 40 MG tablet, Take 40 mg by mouth daily., Disp: , Rfl:     simvastatin (ZOCOR) 20 MG tablet, Take 20 mg by mouth nightly., Disp: , Rfl: 0    VENTOLIN HFA 108 (90 Base) MCG/ACT inhaler, INHALE 2 PUFFS BY MOUTH EVERY 6 HOURS AS NEEDED, Disp: , Rfl: 1    PAST MEDICAL HISTORY:  Past Medical History:   Diagnosis Date    Anxiety     Asthma     GERD (gastroesophageal reflux disease)     High cholesterol     Migraine     Multiple allergies        PAST SURGICAL HISTORY:  Past Surgical History:   Procedure Laterality Date    catheter arrhythmia         SOCIAL HISTORY:  Social History   Substance Use Topics    Smoking status: Never Smoker    Smokeless tobacco: Never Used    Alcohol use Yes      Comment: ocassional       FAMILY HISTORY:  Family History   Problem Relation Age of Onset    Cholesterol/Lipid Disorder Mother     Hypertension Father     Cancer Father      TONGUE    Colon Cancer Maternal Grandmother     Heart Attack Maternal Grandmother     Hypertension Maternal Grandmother      Asthma Maternal Grandmother        PHYSICAL EXAM:  Vitals:    06/03/17 1038   BP: 114/85   BP Location: Left arm   BP Patient Position: Sitting   Pulse: 89   Temp: 98.5 F (36.9 C)   TempSrc: Oral   Weight: 80 kg (176 lb 5.9 oz)    Body mass index is 30.27 kg/(m^2).      GEN:A&O X3,  well appearing, seated in exam room speaking in full sentences  HEENT: normocephalic/atraumatic, anicteric sclera, moist mucous membranes, no oral lesions  or thrush  NECK: supple, no lymphadenopathy  CV: S1S2, regular rate and rhythm, no murmurs/rubs/gallops  PULM: clear to auscultation bilaterally, good inspiratory effort, no clear crackles or rales  ABD: soft, non-tender, non-distended, normal bowel sounds, no rebound or guarding, no appreciable hepatosplenomegaly  EXT: No cyanosis, clubbing, or edema.  SKIN: no petechiae or rash, complete skin exam not performed      RECENT LAB DATA:  No results found for this or any previous visit (from the past 1008 hour(s)).              Hind was seen today for recheck.    Diagnoses and all orders for this visit:    RUQ pain  -     CT Abdomen And Pelvis WO/W Contrast; Future  -     Comprehensive Metabolic Panel Green; Future  -     CBC w/ Diff Lavender; Future  -     Tissue Transglutaminase Antibody, IgA Yellow serum separator tube; Future  -     Tissue Transglutaminase IgG - See Instructions; Future  -     Gliadin Peptide Antibodies, IgA & IgG Yellow serum separator tube; Future  -     C-Reactive Protein, Blood Green Plasma Separator Tube; Future  -     Lipase, Blood Green Plasma Separator Tube; Future  -     GI Endosopy Procedures Service Request  -     Comprehensive Metabolic Panel Green  -     CBC w/ Diff Lavender  -     Tissue Transglutaminase Antibody, IgA Yellow serum separator tube  -     Tissue Transglutaminase IgG - See Instructions  -     Gliadin Peptide Antibodies, IgA & IgG Yellow serum separator tube  -     C-Reactive Protein, Blood Green Plasma Separator Tube   -     Lipase, Blood Green Plasma Separator Tube    LUQ pain  -     CT Abdomen And Pelvis WO/W Contrast; Future  -     Comprehensive Metabolic Panel Green; Future  -     CBC w/ Diff Lavender; Future  -     Tissue Transglutaminase Antibody, IgA Yellow serum separator tube; Future  -     Tissue Transglutaminase IgG - See Instructions; Future  -     Gliadin Peptide Antibodies, IgA & IgG Yellow serum separator tube; Future  -     C-Reactive Protein, Blood Green Plasma Separator Tube; Future  -     Lipase, Blood Green Plasma Separator Tube; Future  -     GI Endosopy Procedures Service Request  -     Comprehensive Metabolic Panel Green  -     CBC w/ Diff Lavender  -     Tissue Transglutaminase Antibody, IgA Yellow serum separator tube  -     Tissue Transglutaminase IgG - See Instructions  -     Gliadin Peptide Antibodies, IgA & IgG Yellow serum separator tube  -     C-Reactive Protein, Blood Green Plasma Separator Tube  -     Lipase, Blood Green Plasma Separator Tube    Gastroesophageal reflux disease, esophagitis presence not specified  -     GI Endosopy Procedures Service Request    Change in bowel habits  -     GI Endosopy Procedures Service Request    Nausea and vomiting, intractability of vomiting not specified, unspecified vomiting type  -     GI Endosopy Procedures Service Request

## 2017-06-07 ENCOUNTER — Ambulatory Visit: Payer: Medicare HMO | Admitting: Neurology

## 2017-06-16 ENCOUNTER — Other Ambulatory Visit: Payer: Self-pay

## 2017-06-16 ENCOUNTER — Encounter (HOSPITAL_COMMUNITY): Payer: Self-pay | Admitting: Emergency Medicine

## 2017-06-16 ENCOUNTER — Ambulatory Visit (HOSPITAL_COMMUNITY)
Admission: EM | Admit: 2017-06-16 | Discharge: 2017-06-16 | Disposition: A | Discharge status: Home / Self Care | Payer: Medicare HMO | Attending: Emergency Medicine | Admitting: Emergency Medicine

## 2017-06-16 DIAGNOSIS — R197 Diarrhea, unspecified: Secondary | ICD-10-CM

## 2017-06-16 DIAGNOSIS — R110 Nausea: Secondary | ICD-10-CM

## 2017-06-16 DIAGNOSIS — J069 Acute upper respiratory infection, unspecified: Secondary | ICD-10-CM

## 2017-06-16 DIAGNOSIS — K529 Noninfective gastroenteritis and colitis, unspecified: Secondary | ICD-10-CM

## 2017-06-16 MED ORDER — ONDANSETRON HCL 4 MG PO TABS: 4.0000 mg | ORAL_TABLET | Freq: Four times a day (QID) | ORAL | 0 refills | Status: DC

## 2017-06-16 NOTE — Discharge Instructions (Signed)
Clear liquids only, Pedialyte is the best. No solid foods until your nausea and diarrhea is improved. Take the Imodium one to 2 a day to slow the diarrhea down but not to stop it. Take Zofran for nausea. Take Allegra for drainage. If this is not strong enough take Chlor-Trimeton 4 mg tablets every 4-6 hours. This may cause drowsiness. Tylenol every 4 hours for fever and discomfort. For worsening, new symptoms or problems may return or if needed go to emergency department.

## 2017-06-16 NOTE — ED Provider Notes (Signed)
[place]    CSN: [id number] Arrival date & time: 06/16/17  1338     History   Chief Complaint Chief Complaint  Patient presents with  . URI  . Diarrhea    HPI Lynn Molina is a 46 y.o. female.   46 year old obese female complaining of a one-week history of fever, fatigue and malaise. The fever has substantially decreased, at the outset 103.4 at home. Now 99.8. She has had diarrhea for about 5 days. She was having it quite frequently initially and now 1-2 times per hour, small volumes. Decreased appetite. She has no abdominal pain or vomiting but complains of nausea. She complains of a sore throat, PND and a cough. Only medication is Tylenol.      Past Medical History:  Diagnosis Date  . Anemia    hx  . Asthma    rarely uses inhaler  . Environmental allergies   . Eye pain, right   . GERD (gastroesophageal reflux disease)    occasional but diet controlled, no med  . Head injury    Hammer fell on head - tx w lyrica  . Headache    tx with lyrica  . Nerve damage    Head  . 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 SVD (spontaneous vaginal delivery)    x 2    Patient Active Problem List   Diagnosis Date Noted  . Confusion 11/04/2016  . History of traumatic brain injury 11/04/2016  . Non-cardiac chest pain 11/04/2016  . Headaches due to old head injury 05/15/2013    Past Surgical History:  Procedure Laterality Date  . DENTAL SURGERY     tooth ext with general anesthesia  . DILATATION & CURETTAGE/HYSTEROSCOPY WITH MYOSURE N/A 01/21/2016   Procedure: DILATATION & CURETTAGE/HYSTEROSCOPY WITH MYOSURE;  Surgeon: Aloha Gell, MD;  Location: [facility];  Service: Gynecology;  Laterality: N/A;  . TUBAL LIGATION    . WISDOM TOOTH EXTRACTION      OB History    Gravida Para Term Preterm AB Living   2 2 2  0 0 2   SAB TAB Ectopic Multiple Live Births   0 0 0 0 2       Home Medications    Prior to  Admission medications   Medication Sig Start Date End Date Taking? Authorizing Provider  acetaminophen (TYLENOL) 500 MG tablet Take 500 mg by mouth every 6 (six) hours as needed for mild pain, moderate pain or headache.     [provider]  albuterol (PROVENTIL HFA;VENTOLIN HFA) 108 (90 BASE) MCG/ACT inhaler Inhale 2 puffs into the lungs every 6 (six) hours as needed for wheezing or shortness of breath.    [provider]  ferrous sulfate 220 (44 Fe) MG/5ML solution Take 660 mg by mouth daily with breakfast.     [provider]  HYDROcodone-acetaminophen (NORCO/VICODIN) 5-325 MG tablet Take 2 tablets by mouth every 6 (six) hours as needed. Patient taking differently: Take 2 tablets by mouth every 6 (six) hours as needed for moderate pain or severe pain.  02/14/16   Jorje Guild, NP  loratadine (ALAVERT) 10 MG tablet Take 10 mg by mouth daily.    [provider]  ondansetron (ZOFRAN) 4 MG tablet Take 1 tablet (4 mg total) by mouth every 6 (six) hours. 06/16/17   Janne Napoleon, NP  pregabalin (LYRICA) 150 MG capsule Take 150 mg by mouth 3 (three) times daily.  [provider]  tranexamic acid (LYSTEDA) 650 MG TABS tablet Take 1,300 mg by mouth 3 (three) times daily.    [provider]    Family History Family History  Problem Relation Age of Onset  . Breast cancer Mother   . Heart Problems Father   . Leukemia Brother   . Heart Problems Daughter   . Prostate cancer Paternal Grandfather   . Cancer Brother   . Diabetes Paternal Grandmother   . High blood pressure Paternal Grandmother   . Diabetes Maternal Grandmother   . High blood pressure Maternal Grandmother     Social History Social History   Tobacco Use  . Smoking status: Never Smoker  . Smokeless tobacco: Never Used  Substance Use Topics  . Alcohol use: Yes    Comment: occasional wine  . Drug use: No     Allergies   Tessalon [benzonatate] and Zyrtec [cetirizine  hcl]   Review of Systems Review of Systems  Constitutional: Positive for activity change, appetite change, fatigue and fever. Negative for chills.  HENT: Positive for postnasal drip, rhinorrhea and sore throat. Negative for congestion and facial swelling.   Eyes: Negative.   Respiratory: Positive for cough. Negative for shortness of breath.   Cardiovascular: Negative.   Gastrointestinal: Positive for diarrhea and nausea. Negative for abdominal pain.  Genitourinary: Negative.   Musculoskeletal: Negative.  Negative for neck pain and neck stiffness.  Skin: Negative for pallor and rash.  Neurological: Negative.      Physical Exam Triage Vital Signs ED Triage Vitals  Enc Vitals Group     BP 06/16/17 1404 114/78     Pulse Rate 06/16/17 1404 (!) 104     Resp 06/16/17 1404 (!) 22     Temp 06/16/17 1404 99.8 F (37.7 C)     Temp Source 06/16/17 1404 Oral     SpO2 06/16/17 1404 99 %     Weight --      Height --      Head Circumference --      Peak Flow --      Pain Score 06/16/17 1401 4     Pain Loc --      Pain Edu? --      Excl. in [place]? --    No data found.  Updated Vital Signs BP 114/78 (BP Location: Right Arm) Comment (BP Location): large cuff  Pulse (!) 104   Temp 99.8 F (37.7 C) (Oral)   Resp (!) 22   SpO2 99%   Visual Acuity Right Eye Distance:   Left Eye Distance:   Bilateral Distance:    Right Eye Near:   Left Eye Near:    Bilateral Near:     Physical Exam  Constitutional: She is oriented to person, place, and time. She appears well-developed and well-nourished. No distress.  HENT:  Mouth/Throat: No oropharyngeal exudate.  TM's normal OP with minor redness and cobblestoning with clear PND.  Eyes: EOM are normal.  Neck: Normal range of motion. Neck supple.  Cardiovascular: Normal rate, regular rhythm and normal heart sounds.  Pulmonary/Chest: Effort normal and breath sounds normal. No respiratory distress. She has no wheezes. She has no rales.   Abdominal: Soft. Bowel sounds are normal. She exhibits no distension. There is no tenderness. There is no rebound and no guarding.  Musculoskeletal: Normal range of motion. She exhibits no edema.  Lymphadenopathy:    She has no cervical adenopathy.  Neurological: She is alert and oriented to person, place,  and time.  Skin: Skin is warm and dry. No rash noted.  Psychiatric: She has a normal mood and affect.  Nursing note and vitals reviewed.    UC Treatments / Results  Labs (all labs ordered are listed, but only abnormal results are displayed) Labs Reviewed - No data to display  EKG  EKG Interpretation None       Radiology No results found.  Procedures Procedures (including critical care time)  Medications Ordered in UC Medications - No data to display   Initial Impression / Assessment and Plan / UC Course  I have reviewed the triage vital signs and the nursing notes.  Pertinent labs & imaging results that were available during my care of the patient were reviewed by me and considered in my medical decision making (see chart for details).    Clear liquids only, Pedialyte is the best. No solid foods until your nausea and diarrhea is improved. Take the Imodium one to 2 a day to slow the diarrhea down but not to stop it. Take Zofran for nausea. Take Allegra for drainage. If this is not strong enough take Chlor-Trimeton 4 mg tablets every 4-6 hours. This may cause drowsiness. Tylenol every 4 hours for fever and discomfort. For worsening, new symptoms or problems may return or if needed go to emergency department.    Final Clinical Impressions(s) / UC Diagnoses   Final diagnoses:  Gastroenteritis  Nausea without vomiting  Diarrhea of presumed infectious origin  Viral upper respiratory tract infection    ED Discharge Orders        Ordered    ondansetron (ZOFRAN) 4 MG tablet  Every 6 hours     06/16/17 1430       Controlled Substance Prescriptions [place] Controlled  Substance Registry consulted? Not Applicable   Janne Napoleon, NP 06/16/17 1439

## 2017-06-16 NOTE — ED Triage Notes (Signed)
Patient has a hand written note with her describing symptoms and onset.  Onset Friday of a fever reported as 103.4.  Since then, fever has run around 101-100.8.  On Saturday started having diarrhea every 2 hours.  Patient says she still has diarrhea.  Patient says she has decreased appetite.  Patient says she has lost 12 pounds?  Patient writes she has a severe stutter and writing is easier for her

## 2017-09-07 ENCOUNTER — Encounter: Payer: Self-pay | Admitting: Internal Medicine

## 2017-09-07 ENCOUNTER — Ambulatory Visit (INDEPENDENT_AMBULATORY_CARE_PROVIDER_SITE_OTHER): Payer: Medicare HMO | Admitting: Internal Medicine

## 2017-09-07 VITALS — BP 130/88 | HR 80 | Resp 14 | Ht 61.0 in | Wt 212.0 lb

## 2017-09-07 DIAGNOSIS — S0990XS Unspecified injury of head, sequela: Secondary | ICD-10-CM

## 2017-09-07 DIAGNOSIS — J3089 Other allergic rhinitis: Secondary | ICD-10-CM

## 2017-09-07 DIAGNOSIS — F8081 Childhood onset fluency disorder: Secondary | ICD-10-CM

## 2017-09-07 DIAGNOSIS — G44309 Post-traumatic headache, unspecified, not intractable: Secondary | ICD-10-CM

## 2017-09-07 DIAGNOSIS — Z6841 Body Mass Index (BMI) 40.0 and over, adult: Secondary | ICD-10-CM

## 2017-09-07 DIAGNOSIS — J4540 Moderate persistent asthma, uncomplicated: Secondary | ICD-10-CM

## 2017-09-07 MED ORDER — PREGABALIN 150 MG PO CAPS: 150.0000 mg | ORAL_CAPSULE | Freq: Three times a day (TID) | ORAL | 11 refills | Status: AC

## 2017-09-07 MED ORDER — SYMBICORT 80-4.5 MCG/ACT IN AERO: INHALATION_SPRAY | RESPIRATORY_TRACT | 11 refills | Status: AC

## 2017-09-07 MED ORDER — HYDROCODONE-ACETAMINOPHEN 5-325 MG PO TABS: ORAL_TABLET | ORAL | 0 refills | Status: DC

## 2017-09-07 MED ORDER — ALBUTEROL SULFATE HFA 108 (90 BASE) MCG/ACT IN AERS: 2.0000 | INHALATION_SPRAY | Freq: Four times a day (QID) | RESPIRATORY_TRACT | 1 refills | Status: DC | PRN

## 2017-09-07 MED ORDER — FEXOFENADINE HCL 180 MG PO TABS: 180.0000 mg | ORAL_TABLET | Freq: Every day | ORAL | 11 refills | Status: DC

## 2017-09-07 MED ORDER — TRANEXAMIC ACID 650 MG PO TABS: 1300.0000 mg | ORAL_TABLET | Freq: Three times a day (TID) | ORAL | 2 refills | Status: DC

## 2017-09-07 NOTE — Progress Notes (Signed)
LCSW completed new pt screening with pt. Patient reported that she is having several mental health symptoms, including insomnia, fatigue, feelings of worthlessness, trouble concentrating, restlessness, and anxiety. She shared that she was seeing a therapist at [facility] but she didn't like him, so she has stopped going. Pt reported that she would like to start counseling with LCSW and scheduled a session. Pt denied any issues with social determinants of health.
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
## 2017-09-11 ENCOUNTER — Encounter: Payer: Self-pay | Admitting: Internal Medicine

## 2017-09-11 DIAGNOSIS — J45909 Unspecified asthma, uncomplicated: Secondary | ICD-10-CM | POA: Insufficient documentation

## 2017-09-11 DIAGNOSIS — F8081 Childhood onset fluency disorder: Secondary | ICD-10-CM

## 2017-09-11 DIAGNOSIS — Z6841 Body Mass Index (BMI) 40.0 and over, adult: Secondary | ICD-10-CM

## 2017-09-11 DIAGNOSIS — J3089 Other allergic rhinitis: Secondary | ICD-10-CM

## 2017-09-11 HISTORY — DX: Morbid (severe) obesity due to excess calories: E66.01

## 2017-09-11 HISTORY — DX: Childhood onset fluency disorder: F80.81

## 2017-09-11 HISTORY — DX: Other allergic rhinitis: J30.89

## 2017-09-12 ENCOUNTER — Other Ambulatory Visit: Payer: Self-pay | Admitting: Licensed Clinical Social Worker

## 2017-09-20 ENCOUNTER — Other Ambulatory Visit: Payer: Self-pay | Admitting: Licensed Clinical Social Worker

## 2017-09-23 ENCOUNTER — Other Ambulatory Visit: Payer: Self-pay | Admitting: Licensed Clinical Social Worker

## 2017-09-29 ENCOUNTER — Ambulatory Visit: Payer: Medicare HMO | Admitting: Licensed Clinical Social Worker

## 2017-09-29 DIAGNOSIS — F32A Depression, unspecified: Secondary | ICD-10-CM

## 2017-09-29 DIAGNOSIS — F329 Major depressive disorder, single episode, unspecified: Secondary | ICD-10-CM
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
## 2017-10-14 ENCOUNTER — Ambulatory Visit: Payer: Self-pay | Admitting: Licensed Clinical Social Worker

## 2017-10-14 DIAGNOSIS — F32A Depression, unspecified: Secondary | ICD-10-CM

## 2017-10-14 DIAGNOSIS — F329 Major depressive disorder, single episode, unspecified: Secondary | ICD-10-CM

## 2017-10-19 NOTE — Progress Notes (Signed)
   THERAPY PROGRESS NOTE  Session Time: 44min  Participation Level: Active  Behavioral Response: CasualAlertDepressed  Type of Therapy: Individual Therapy  Treatment Goals addressed: Coping  Interventions: Strength-based and Supportive  Summary: JENIKA CHIEM is a 46 y.o. female who presents with a depressed mood and appropriate affect. She reported that she has been feeling depressed. She shared that a lot of her depression comes from thinking about the past and the difficult relationship she has had with her parents. She shared about the difficult changes she has experienced since the onset of her severe stutter. She described the way people treat her like she is stupid, when she actually has a high IQ. She stated that her self-esteem has really suffered over the past few years. Concetta identified her main counseling goal as learning how to let go of pain from the past.   Suicidal/Homicidal: Nowithout intent/plan  Therapist Response: LCSW utilized supportive counseling techniques throughout the session in order to validate emotions and encourage open expression of emotion. LCSW and Audreena processed about her feelings and her self-esteem. LCSW and Liddy discussed goals for counseling sessions and identified the focus of treatment.  Plan: Return again in 2 weeks.   Metta Clines, LCSW 10/19/2017

## 2017-10-28 ENCOUNTER — Other Ambulatory Visit: Payer: Medicaid Other | Admitting: Licensed Clinical Social Worker

## 2017-11-08 ENCOUNTER — Ambulatory Visit: Payer: Self-pay | Admitting: Licensed Clinical Social Worker

## 2017-11-08 DIAGNOSIS — F32A Depression, unspecified: Secondary | ICD-10-CM

## 2017-11-08 DIAGNOSIS — F329 Major depressive disorder, single episode, unspecified: Secondary | ICD-10-CM

## 2017-11-14 ENCOUNTER — Ambulatory Visit: Payer: Self-pay | Admitting: Internal Medicine

## 2017-11-15 NOTE — Progress Notes (Signed)
   THERAPY PROGRESS NOTE  Session Time: 41min  Participation Level: Active  Behavioral Response: Neat and Well GroomedAlertEuthymic  Type of Therapy: Individual Therapy  Treatment Goals addressed: Coping  Interventions: Motivational Interviewing and Supportive  Summary: Lynn Molina is a 47 y.o. female who presents with a euthymic mood and appropriate affect. She reported that she is frustrated with the care that she has received at the clinic. She gave a letter to LCSW to share with the clinical staff regarding her complaints. She stated that she is planning to find a new clinic for primary care and does not feel that there is anything to be done to resolve the issue. She reported that she would like to continue counseling with LCSW. Lynn Molina completed a PHQ-9 screener to assess depressive symptoms and scored a 10, indicating mild to moderate depressive symptoms. She completed a GAD-7 and scored an 8, indicating mild anxious symptoms. Lynn Molina shared about her current relationship with her mother, which is complicated and conflicted. She shared that her mother is constantly pressuring her about treatment for her stutter as it is "inconvenient" for her (mother). Lynn Molina shared about the ways that her mother pressures and manipulates her. She also shared about the ways that she enjoys her relationship with her mother. She appeared somewhat receptive to LCSW feedback about the importance of setting clear and firm boundaries with her mother.   Suicidal/Homicidal: Nowithout intent/plan  Therapist Response: LCSW administered a PHQ-9 in order to assess the level of current depressive symptoms. LCSW administered a GAD-7 in order to assess the current level of anxiety symptoms.  LCSW utilized supportive counseling techniques throughout the session in order to validate emotions and encourage open expression of emotion. LCSW and Lynn Molina processed about her relationship with her mother; LCSW provided suggestions  about specific boundaries that she could set with her mother in order to feel better (ie, not talking about her stutter, switching to email for communication, etc)  Plan: Return again in 2 weeks.    Metta Clines, LCSW 11/15/2017

## 2017-11-21 ENCOUNTER — Ambulatory Visit: Payer: Self-pay | Admitting: Licensed Clinical Social Worker

## 2017-11-21 DIAGNOSIS — F32A Depression, unspecified: Secondary | ICD-10-CM

## 2017-11-21 DIAGNOSIS — F329 Major depressive disorder, single episode, unspecified: Secondary | ICD-10-CM
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
## 2017-12-05 ENCOUNTER — Ambulatory Visit: Payer: Self-pay | Admitting: Licensed Clinical Social Worker

## 2017-12-05 DIAGNOSIS — F32A Depression, unspecified: Secondary | ICD-10-CM

## 2017-12-05 DIAGNOSIS — F329 Major depressive disorder, single episode, unspecified: Secondary | ICD-10-CM
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
## 2017-12-23 ENCOUNTER — Other Ambulatory Visit: Payer: Medicare HMO | Admitting: Licensed Clinical Social Worker

## 2017-12-26 ENCOUNTER — Other Ambulatory Visit: Payer: Medicare HMO | Admitting: Licensed Clinical Social Worker

## 2017-12-29 ENCOUNTER — Ambulatory Visit: Payer: Self-pay | Admitting: Licensed Clinical Social Worker

## 2017-12-29 DIAGNOSIS — F32A Depression, unspecified: Secondary | ICD-10-CM

## 2017-12-29 DIAGNOSIS — F329 Major depressive disorder, single episode, unspecified: Secondary | ICD-10-CM

## 2018-01-10 ENCOUNTER — Ambulatory Visit: Payer: Medicare HMO | Admitting: Licensed Clinical Social Worker

## 2018-01-10 DIAGNOSIS — F32A Depression, unspecified: Secondary | ICD-10-CM

## 2018-01-10 DIAGNOSIS — F329 Major depressive disorder, single episode, unspecified: Secondary | ICD-10-CM
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
## 2018-01-24 ENCOUNTER — Ambulatory Visit: Payer: Self-pay | Admitting: Licensed Clinical Social Worker

## 2018-01-24 DIAGNOSIS — F329 Major depressive disorder, single episode, unspecified: Secondary | ICD-10-CM

## 2018-01-24 DIAGNOSIS — F32A Depression, unspecified: Secondary | ICD-10-CM
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
## 2018-02-08 ENCOUNTER — Other Ambulatory Visit: Payer: Medicare HMO | Admitting: Licensed Clinical Social Worker

## 2018-02-17 ENCOUNTER — Ambulatory Visit: Payer: Self-pay | Admitting: Licensed Clinical Social Worker

## 2018-02-17 DIAGNOSIS — F32A Depression, unspecified: Secondary | ICD-10-CM

## 2018-02-17 DIAGNOSIS — F329 Major depressive disorder, single episode, unspecified: Secondary | ICD-10-CM
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
## 2018-03-02 ENCOUNTER — Other Ambulatory Visit: Payer: Medicare HMO | Admitting: Licensed Clinical Social Worker

## 2018-04-04 ENCOUNTER — Encounter: Payer: Self-pay | Admitting: Obstetrics

## 2018-04-04 ENCOUNTER — Ambulatory Visit (INDEPENDENT_AMBULATORY_CARE_PROVIDER_SITE_OTHER): Payer: Medicare HMO | Admitting: Obstetrics

## 2018-04-04 ENCOUNTER — Other Ambulatory Visit (HOSPITAL_COMMUNITY)
Admission: RE | Admit: 2018-04-04 | Discharge: 2018-04-04 | Disposition: A | Discharge status: Home / Self Care | Payer: Medicare HMO | Source: Ambulatory Visit | Attending: Obstetrics | Admitting: Obstetrics

## 2018-04-04 VITALS — BP 123/75 | HR 88 | Wt 220.0 lb

## 2018-04-04 DIAGNOSIS — Z01419 Encounter for gynecological examination (general) (routine) without abnormal findings: Secondary | ICD-10-CM | POA: Insufficient documentation

## 2018-04-04 DIAGNOSIS — Z1272 Encounter for screening for malignant neoplasm of vagina: Secondary | ICD-10-CM | POA: Diagnosis not present

## 2018-04-04 DIAGNOSIS — Z6841 Body Mass Index (BMI) 40.0 and over, adult: Secondary | ICD-10-CM

## 2018-04-04 DIAGNOSIS — N939 Abnormal uterine and vaginal bleeding, unspecified: Secondary | ICD-10-CM

## 2018-04-04 DIAGNOSIS — R479 Unspecified speech disturbances: Secondary | ICD-10-CM

## 2018-04-04 DIAGNOSIS — D500 Iron deficiency anemia secondary to blood loss (chronic): Secondary | ICD-10-CM

## 2018-04-04 MED ORDER — FERRALET 90 90-1 MG PO TABS: 1.0000 | ORAL_TABLET | Freq: Every day | ORAL | 0 refills | Status: DC

## 2018-04-04 MED ORDER — NORETHINDRONE ACETATE 5 MG PO TABS
10.0000 mg | ORAL_TABLET | Freq: Every day | ORAL | 2 refills | Status: DC
Start: 2018-04-04 — End: 2018-06-22

## 2018-04-04 MED ORDER — VITAFOL FE+ 90-1-200 & 50 MG PO CPPK: 2.0000 | ORAL_CAPSULE | Freq: Every day | ORAL | 0 refills | Status: DC

## 2018-04-04 NOTE — Progress Notes (Signed)
NGYN pt presents for Annual Exam today. Pt cannot speak having to write responses down on a note pad.   LMP: uncertaing has been irregular x 2 1/2 weeks now.  Last pap: ??uncertain  Contraception: BTL  Mammogram: 2019 per pt   STD Screening:Declines     CC: vaginal pain 8/10 usually that is sharp, dull and achy .  Pt has heavy prolonged cycles no longer taking Lysteda it did not help

## 2018-04-04 NOTE — Patient Instructions (Signed)
Endometrial Biopsy Endometrial biopsy is a procedure in which a tissue sample is taken from inside the uterus. The sample is taken from the endometrium, which is the lining of the uterus. The tissue sample is then checked under a microscope to see if the tissue is normal or abnormal. This procedure helps to determine where you are in your menstrual cycle and how hormone levels are affecting the lining of the uterus. This procedure may also be used to evaluate uterine bleeding or to diagnose endometrial cancer, endometrial tuberculosis, polyps, or other inflammatory conditions. Tell a health care provider about:  Any allergies you have.  All medicines you are taking, including vitamins, herbs, eye drops, creams, and over-the-counter medicines.  Any problems you or family members have had with anesthetic medicines.  Any blood disorders you have.  Any surgeries you have had.  Any medical conditions you have.  Whether you are pregnant or may be pregnant. What are the risks? Generally, this is a safe procedure. However, problems may occur, including:  Bleeding.  Pelvic infection.  Puncture of the wall of the uterus with the biopsy device (rare).  What happens before the procedure?  Keep a record of your menstrual cycles as told by your health care provider. You may need to schedule your procedure for a specific time in your cycle.  You may want to bring a sanitary pad to wear after the procedure.  Ask your health care provider about: ? Changing or stopping your regular medicines. This is especially important if you are taking diabetes medicines or blood thinners. ? Taking medicines such as aspirin and ibuprofen. These medicines can thin your blood. Do not take these medicines before your procedure if your health care provider instructs you not to.  Plan to have someone take you home from the hospital or clinic. What happens during the procedure?  To lower your risk of  infection: ? Your health care team will wash or sanitize their hands.  You will lie on an exam table with your feet and legs supported as in a pelvic exam.  Your health care provider will insert an instrument (speculum) into your vagina to see your cervix.  Your cervix will be cleansed with an antiseptic solution.  A medicine (local anesthetic) will be used to numb the cervix.  A forceps instrument (tenaculum) will be used to hold your cervix steady for the biopsy.  A thin, rod-like instrument (uterine sound) will be inserted through your cervix to determine the length of your uterus and the location where the biopsy sample will be removed.  A thin, flexible tube (catheter) will be inserted through your cervix and into the uterus. The catheter will be used to collect the biopsy sample from your endometrial tissue.  The catheter and speculum will then be removed, and the tissue sample will be sent to a lab for examination. What happens after the procedure?  You will rest in a recovery area until you are ready to go home.  You may have mild cramping and a small amount of vaginal bleeding. This is normal.  It is up to you to get the results of your procedure. Ask your health care provider, or the department that is doing the procedure, when your results will be ready. Summary  Endometrial biopsy is a procedure in which a tissue sample is taken from the endometrium, which is the lining of the uterus.  This procedure may help to diagnose menstrual cycle problems, abnormal bleeding, or other conditions affecting   the endometrium.  Before the procedure, keep a record of your menstrual cycles as told by your health care provider.  The tissue sample that is removed will be checked under a microscope to see if it is normal or abnormal. This information is not intended to replace advice given to you by your health care provider. Make sure you discuss any questions you have with your health care  provider. Document Released: 10/22/2004 Document Revised: 07/07/2016 Document Reviewed: 07/07/2016 Elsevier Interactive Patient Education  2017 [employer].  Endometrial Biopsy Endometrial biopsy is a procedure in which a tissue sample is taken from inside the uterus. The sample is taken from the endometrium, which is the lining of the uterus. The tissue sample is then checked under a microscope to see if the tissue is normal or abnormal. This procedure helps to determine where you are in your menstrual cycle and how hormone levels are affecting the lining of the uterus. This procedure may also be used to evaluate uterine bleeding or to diagnose endometrial cancer, endometrial tuberculosis, polyps, or other inflammatory conditions. Tell a health care provider about:  Any allergies you have.  All medicines you are taking, including vitamins, herbs, eye drops, creams, and over-the-counter medicines.  Any problems you or family members have had with anesthetic medicines.  Any blood disorders you have.  Any surgeries you have had.  Any medical conditions you have.  Whether you are pregnant or may be pregnant. What are the risks? Generally, this is a safe procedure. However, problems may occur, including:  Bleeding.  Pelvic infection.  Puncture of the wall of the uterus with the biopsy device (rare).  What happens before the procedure?  Keep a record of your menstrual cycles as told by your health care provider. You may need to schedule your procedure for a specific time in your cycle.  You may want to bring a sanitary pad to wear after the procedure.  Ask your health care provider about: ? Changing or stopping your regular medicines. This is especially important if you are taking diabetes medicines or blood thinners. ? Taking medicines such as aspirin and ibuprofen. These medicines can thin your blood. Do not take these medicines before your procedure if your health care provider  instructs you not to.  Plan to have someone take you home from the hospital or clinic. What happens during the procedure?  To lower your risk of infection: ? Your health care team will wash or sanitize their hands.  You will lie on an exam table with your feet and legs supported as in a pelvic exam.  Your health care provider will insert an instrument (speculum) into your vagina to see your cervix.  Your cervix will be cleansed with an antiseptic solution.  A medicine (local anesthetic) will be used to numb the cervix.  A forceps instrument (tenaculum) will be used to hold your cervix steady for the biopsy.  A thin, rod-like instrument (uterine sound) will be inserted through your cervix to determine the length of your uterus and the location where the biopsy sample will be removed.  A thin, flexible tube (catheter) will be inserted through your cervix and into the uterus. The catheter will be used to collect the biopsy sample from your endometrial tissue.  The catheter and speculum will then be removed, and the tissue sample will be sent to a lab for examination. What happens after the procedure?  You will rest in a recovery area until you are ready to go  home.  You may have mild cramping and a small amount of vaginal bleeding. This is normal.  It is up to you to get the results of your procedure. Ask your health care provider, or the department that is doing the procedure, when your results will be ready. Summary  Endometrial biopsy is a procedure in which a tissue sample is taken from the endometrium, which is the lining of the uterus.  This procedure may help to diagnose menstrual cycle problems, abnormal bleeding, or other conditions affecting the endometrium.  Before the procedure, keep a record of your menstrual cycles as told by your health care provider.  The tissue sample that is removed will be checked under a microscope to see if it is normal or abnormal. This  information is not intended to replace advice given to you by your health care provider. Make sure you discuss any questions you have with your health care provider. Document Released: 10/22/2004 Document Revised: 07/07/2016 Document Reviewed: 07/07/2016 Elsevier Interactive Patient Education  2017 [employer].

## 2018-04-04 NOTE — Progress Notes (Signed)
Subjective:        Lynn Molina is a 47 y.o. female here for a routine exam.  Current complaints: Complains of heavy, irregular and prolonged periods.  Has a history or uterine fibroids, submucous, and had hysteroscopic resection with MyoSure in 2017, but could not resect all of submucous fibroid because of myometrial depth.  She continues to have heavy periods with subsequent severe anemia.  She now seeks definitive surgical management..    Personal health questionnaire:  Is patient Ashkenazi Jewish, have a family history of breast and/or ovarian cancer: no Is there a family history of uterine cancer diagnosed at age < 70, gastrointestinal cancer, urinary tract cancer, family member who is a Field seismologist syndrome-associated carrier: no Is the patient overweight and hypertensive, family history of diabetes, personal history of gestational diabetes, preeclampsia or PCOS: no Is patient over 50, have PCOS,  family history of premature CHD under age 17, diabetes, smoke, have hypertension or peripheral artery disease:  no At any time, has a partner hit, kicked or otherwise hurt or frightened you?: no Over the past 2 weeks, have you felt down, depressed or hopeless?: no Over the past 2 weeks, have you felt little interest or pleasure in doing things?:no   Gynecologic History No LMP recorded. Contraception: tubal ligation Last Pap: 2018. Results were: normal Last mammogram: 2019. Results were: normal  Obstetric History OB History  Gravida Para Term Preterm AB Living  2 2 2  0 0 2  SAB TAB Ectopic Multiple Live Births  0 0 0 0 2    # Outcome Date GA Lbr Len/2nd Weight Sex Delivery Anes PTL Lv  2 Term      Vag-Spont     1 Term      Vag-Spont       Past Medical History:  Diagnosis Date  . Anemia    hx  . Asthma    rarely uses inhaler  . Asthma 09/11/2017  . Environmental allergies   . Environmental and seasonal allergies 09/11/2017  . Eye pain, right   . GERD (gastroesophageal reflux  disease)    occasional but diet controlled, no med  . Head injury    Hammer fell on head - tx w lyrica  . Headache    tx with lyrica  . Morbid obesity with BMI of 40.0-44.9, adult ([place]) 09/11/2017  . Nerve damage    Head  . 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 SVD (spontaneous vaginal delivery)    x 2    Past Surgical History:  Procedure Laterality Date  . DENTAL SURGERY     tooth ext with general anesthesia  . DILATATION & CURETTAGE/HYSTEROSCOPY WITH MYOSURE N/A 01/21/2016   Procedure: DILATATION & CURETTAGE/HYSTEROSCOPY WITH MYOSURE;  Surgeon: Aloha Gell, MD;  Location: [facility];  Service: Gynecology;  Laterality: N/A;  . TUBAL LIGATION    . WISDOM TOOTH EXTRACTION       Current Outpatient Medications:  .  acetaminophen (TYLENOL) 500 MG tablet, Take 500 mg by mouth every 6 (six) hours as needed for mild pain, moderate pain or headache. , Disp: , Rfl:  .  albuterol (PROVENTIL HFA;VENTOLIN HFA) 108 (90 Base) MCG/ACT inhaler, Inhale 2 puffs into the lungs every 6 (six) hours as needed for wheezing or shortness of breath., Disp: 1 Inhaler, Rfl: 1 .  ferrous sulfate 220 (44 Fe)  MG/5ML solution, Take 660 mg by mouth daily with breakfast. , Disp: , Rfl:  .  fexofenadine (ALLEGRA) 180 MG tablet, Take 1 tablet (180 mg total) by mouth daily., Disp: 30 tablet, Rfl: 11 .  pregabalin (LYRICA) 150 MG capsule, Take 1 capsule (150 mg total) by mouth 3 (three) times daily., Disp: 90 capsule, Rfl: 11 .  SYMBICORT 80-4.5 MCG/ACT inhaler, 2 puffs twice daily, Disp: 1 Inhaler, Rfl: 11 .  Fe Cbn-Fe Gluc-FA-B12-C-DSS (FERRALET 90) 90-1 MG TABS, Take 1 tablet by mouth daily., Disp: 33 each, Rfl: 0 .  norethindrone (AYGESTIN) 5 MG tablet, Take 2 tablets (10 mg total) by mouth daily., Disp: 60 tablet, Rfl: 2 .  Prenat-FePoly-Metf-FA-DHA-DSS (VITAFOL FE+) 90-1-200 & 50 MG CPPK, Take 2 tablets by  mouth daily before breakfast., Disp: 90 each, Rfl: 0 .  tranexamic acid (LYSTEDA) 650 MG TABS tablet, Take 2 tablets (1,300 mg total) by mouth 3 (three) times daily. (Patient not taking: Reported on 04/04/2018), Disp: 30 tablet, Rfl: 2 Allergies  Allergen Reactions  . Tessalon [Benzonatate] Anaphylaxis and Shortness Of Breath    Swollen face, throat swelling up  . Zyrtec [Cetirizine Hcl] Nausea And Vomiting    Social History   Tobacco Use  . Smoking status: Never Smoker  . Smokeless tobacco: Never Used  Substance Use Topics  . Alcohol use: Yes    Comment: occasional wine    Family History  Problem Relation Age of Onset  . Breast cancer Mother   . Heart Problems Father   . Leukemia Brother   . Heart Problems Daughter   . Prostate cancer Paternal Grandfather   . Cancer Brother   . Diabetes Paternal Grandmother   . High blood pressure Paternal Grandmother   . Diabetes Maternal Grandmother   . High blood pressure Maternal Grandmother       Review of Systems  Constitutional: negative for fatigue and weight loss Respiratory: negative for cough and wheezing Cardiovascular: negative for chest pain, fatigue and palpitations Gastrointestinal: negative for abdominal pain and change in bowel habits Musculoskeletal:negative for myalgias Neurological: negative for gait problems and tremors Behavioral/Psych: negative for abusive relationship, depression Endocrine: negative for temperature intolerance    Genitourinary:POSITIVE for abnormal menstrual periods.  NEGATIVE for genital lesions, hot flashes, sexual problems and vaginal discharge Integument/breast: negative for breast lump, breast tenderness, nipple discharge and skin lesion(s)    Objective:       BP 123/75   Pulse 88   Wt 220 lb (99.8 kg)   BMI 41.57 kg/m  General:   alert  Skin:   no rash or abnormalities  Lungs:   clear to auscultation bilaterally  Heart:   regular rate and rhythm, S1, S2 normal, no murmur, click,  rub or gallop  Breasts:   normal without suspicious masses, skin or nipple changes or axillary nodes  Abdomen:  normal findings: no organomegaly, soft, non-tender and no hernia  Pelvis:  External genitalia: normal general appearance Urinary system: urethral meatus normal and bladder without fullness, nontender Vaginal: normal without tenderness, induration or masses Cervix: normal appearance Adnexa: normal bimanual exam Uterus: anteverted and non-tender, normal size   Lab Review Urine pregnancy test Labs reviewed yes Radiologic studies reviewed yes  50% of 20 min visit spent on counseling and coordination of care.   Assessment:     1. Encounter for routine gynecological examination with Papanicolaou smear of cervix Rx: - Cytology - PAP  2. Abnormal uterine bleeding (AUB) Rx: - US PELVIC COMPLETE WITH TRANSVAGINAL;  Future - norethindrone (AYGESTIN) 5 MG tablet; Take 2 tablets (10 mg total) by mouth daily.  Dispense: 60 tablet; Refill: 2  3. Class 3 severe obesity due to excess calories without serious comorbidity with body mass index (BMI) of 40.0 to 44.9 in adult [facility]) - program of caloric reduction, exercise and behavioral modification recommended  4. Speech disorder  5. Iron deficiency anemia due to chronic blood loss Rx: - Fe Cbn-Fe Gluc-FA-B12-C-DSS (FERRALET 90) 90-1 MG TABS; Take 1 tablet by mouth daily.  Dispense: 33 each; Refill: 0 - Prenat-FePoly-Metf-FA-DHA-DSS (VITAFOL FE+) 90-1-200 & 50 MG CPPK; Take 2 tablets by mouth daily before breakfast.  Dispense: 90 each; Refill: 0   Plan:    Education reviewed: calcium supplements, depression evaluation, low fat, low cholesterol diet, safe sex/STD prevention, self breast exams and weight bearing exercise. Follow up in: 2 weeks.    Meds ordered this encounter  Medications  . norethindrone (AYGESTIN) 5 MG tablet    Sig: Take 2 tablets (10 mg total) by mouth daily.    Dispense:  60 tablet    Refill:  2  . Fe Cbn-Fe  Gluc-FA-B12-C-DSS (FERRALET 90) 90-1 MG TABS    Sig: Take 1 tablet by mouth daily.    Dispense:  33 each    Refill:  0  . Prenat-FePoly-Metf-FA-DHA-DSS (VITAFOL FE+) 90-1-200 & 50 MG CPPK    Sig: Take 2 tablets by mouth daily before breakfast.    Dispense:  90 each    Refill:  0   Orders Placed This Encounter  Procedures  . US PELVIC COMPLETE WITH TRANSVAGINAL    Standing Status:   Future    Standing Expiration Date:   06/05/2019    Order Specific Question:   Reason for Exam (SYMPTOM  OR DIAGNOSIS REQUIRED)    Answer:   AUB    Order Specific Question:   Preferred imaging location?    Answer:   [facility] Simona Huh MD 04-04-2018

## 2018-04-06 LAB — CYTOLOGY - PAP
Diagnosis: NEGATIVE
HPV: NOT DETECTED

## 2018-04-12 ENCOUNTER — Ambulatory Visit (HOSPITAL_COMMUNITY)
Admission: RE | Admit: 2018-04-12 | Discharge: 2018-04-12 | Disposition: A | Discharge status: Home / Self Care | Payer: Medicare HMO | Source: Ambulatory Visit | Attending: Obstetrics | Admitting: Obstetrics

## 2018-04-12 DIAGNOSIS — N939 Abnormal uterine and vaginal bleeding, unspecified: Secondary | ICD-10-CM | POA: Insufficient documentation

## 2018-04-12 DIAGNOSIS — D251 Intramural leiomyoma of uterus: Secondary | ICD-10-CM | POA: Diagnosis not present

## 2018-04-19 ENCOUNTER — Encounter: Payer: Self-pay | Admitting: Obstetrics and Gynecology

## 2018-04-19 ENCOUNTER — Ambulatory Visit (INDEPENDENT_AMBULATORY_CARE_PROVIDER_SITE_OTHER): Payer: Medicare HMO | Admitting: Obstetrics and Gynecology

## 2018-04-19 ENCOUNTER — Other Ambulatory Visit (HOSPITAL_COMMUNITY)
Admission: RE | Admit: 2018-04-19 | Discharge: 2018-04-19 | Disposition: A | Discharge status: Home / Self Care | Payer: Medicare HMO | Source: Ambulatory Visit | Attending: Obstetrics and Gynecology | Admitting: Obstetrics and Gynecology

## 2018-04-19 DIAGNOSIS — D219 Benign neoplasm of connective and other soft tissue, unspecified: Secondary | ICD-10-CM | POA: Diagnosis not present

## 2018-04-19 DIAGNOSIS — N938 Other specified abnormal uterine and vaginal bleeding: Secondary | ICD-10-CM | POA: Insufficient documentation

## 2018-04-19 NOTE — Patient Instructions (Signed)
Endometrial Biopsy, Care After This sheet gives you information about how to care for yourself after your procedure. Your health care provider may also give you more specific instructions. If you have problems or questions, contact your health care provider. What can I expect after the procedure? After the procedure, it is common to have:  Mild cramping.  A small amount of vaginal bleeding for a few days. This is normal.  Follow these instructions at home:  Take over-the-counter and prescription medicines only as told by your health care provider.  Do not douche, use tampons, or have sexual intercourse until your health care provider approves.  Return to your normal activities as told by your health care provider. Ask your health care provider what activities are safe for you.  Follow instructions from your health care provider about any activity restrictions, such as restrictions on strenuous exercise or heavy lifting. Contact a health care provider if:  You have heavy bleeding, or bleed for longer than 2 days after the procedure.  You have bad smelling discharge from your vagina.  You have a fever or chills.  You have a burning sensation when urinating or you have difficulty urinating.  You have severe pain in your lower abdomen. Get help right away if:  You have severe cramps in your stomach or back.  You pass large blood clots.  Your bleeding increases.  You become weak or light-headed, or you pass out. Summary  After the procedure, it is common to have mild cramping and a small amount of vaginal bleeding for a few days.  Do not douche, use tampons, or have sexual intercourse until your health care provider approves.  Return to your normal activities as told by your health care provider. Ask your health care provider what activities are safe for you. This information is not intended to replace advice given to you by your health care provider. Make sure you discuss any  questions you have with your health care provider. Document Released: 04/11/2013 Document Revised: 07/07/2016 Document Reviewed: 07/07/2016 Elsevier Interactive Patient Education  2017 Elsevier Inc. Vaginal Hysterectomy A vaginal hysterectomy is a procedure to remove all or part of the uterus through a small incision in the vagina. In this procedure, your health care provider may remove your entire uterus, including the lower end (cervix). You may need a vaginal hysterectomy to treat:  Uterine fibroids.  A condition that causes the lining of the uterus to grow in other areas (endometriosis).  Problems with pelvic support.  Cancer of the cervix, ovaries, uterus, or tissue that lines the uterus (endometrium).  Excessive (dysfunctional) uterine bleeding.  When removing your uterus, your health care provider may also remove the organs that produce eggs (ovaries) and the tubes that carry eggs to your uterus (fallopian tubes). After a vaginal hysterectomy, you will no longer be able to have a baby. You will also no longer get your menstrual period. Tell a health care provider about:  Any allergies you have.  All medicines you are taking, including vitamins, herbs, eye drops, creams, and over-the-counter medicines.  Any problems you or family members have had with anesthetic medicines.  Any blood disorders you have.  Any surgeries you have had.  Any medical conditions you have.  Whether you are pregnant or may be pregnant. What are the risks? Generally, this is a safe procedure. However, problems may occur, including:  Bleeding.  Infection.  A blood clot that forms in your leg and travels to your lungs (pulmonary embolism).  Damage to surrounding organs.  Pain during sex.  What happens before the procedure?  Ask your health care provider what organs will be removed during surgery.  Ask your health care provider about: ? Changing or stopping your regular medicines. This is  especially important if you are taking diabetes medicines or blood thinners. ? Taking medicines such as aspirin and ibuprofen. These medicines can thin your blood. Do not take these medicines before your procedure if your health care provider instructs you not to.  Follow instructions from your health care provider about eating or drinking restrictions.  Do not use any tobacco products, such as cigarettes, chewing tobacco, and e-cigarettes. If you need help quitting, ask your health care provider.  Plan to have someone take you home after discharge from the hospital. What happens during the procedure?  To reduce your risk of infection: ? Your health care team will wash or sanitize their hands. ? Your skin will be washed with soap.  An IV tube will be inserted into one of your veins.  You may be given antibiotic medicine to help prevent infection.  You will be given one or more of the following: ? A medicine to help you relax (sedative). ? A medicine to numb the area (local anesthetic). ? A medicine to make you fall asleep (general anesthetic). ? A medicine that is injected into an area of your body to numb everything beyond the injection site (regional anesthetic).  Your surgeon will make an incision in your vagina.  Your surgeon will locate and remove all or part of your uterus.  Your ovaries and fallopian tubes may be removed at the same time.  The incision will be closed with stitches (sutures) that dissolve over time. The procedure may vary among health care providers and hospitals. What happens after the procedure?  Your blood pressure, heart rate, breathing rate, and blood oxygen level will be monitored often until the medicines you were given have worn off.  You will be encouraged to get up and walk around after a few hours to help prevent complications.  You may have IV tubes in place for a few days.  You will be given pain medicine as needed.  Do not drive for 24  hours if you were given a sedative. This information is not intended to replace advice given to you by your health care provider. Make sure you discuss any questions you have with your health care provider. Document Released: 10/13/2015 Document Revised: 11/27/2015 Document Reviewed: 07/06/2015 Elsevier Interactive Patient Education  [employer].

## 2018-04-19 NOTE — Progress Notes (Signed)
Patient is in the office for endo biopsy. Pt has speech deficit, writes down everything to communicate.

## 2018-04-19 NOTE — Progress Notes (Signed)
Ms. Manzo is here today for [facility] d/t to DUB. Pt recently seen by Dr Jodi Mourning for yearly GYN exam Pt reported heavy irregular cycles for some time now Known uterine fibroids Had Myosure resection in 2017. Was unable to remove all the fibroids. U/S this month confirms the presence of multiple uterine fibroids She currently is taking Aygestin which is helping with her cycles No cycle since first of Sept TSVD x 2 largest 7# 8 oz Medical problems and medications as listed  PE AF VSS Lungs clear  Heart RRR Abd soft + BS GU nl EGBUS cervix no lesion uterus 8-10 week size mobile no masses or tenderness  ENDOMETRIAL BIOPSY     The indications for endometrial biopsy were reviewed.   Risks of the biopsy including cramping, bleeding, infection, uterine perforation, inadequate specimen and need for additional procedures  were discussed. The patient states she understands and agrees to undergo procedure today. Consent was signed. Time out was performed. Urine HCG was negative. During the pelvic exam, the cervix was prepped with Betadine. A single-toothed tenaculum was placed on the anterior lip of the cervix to stabilize it. The 3 mm pipelle was introduced into the endometrial cavity without difficulty to a depth of 8cm, and a moderate amount of tissue was obtained and sent to pathology. The instruments were removed from the patient's vagina. Minimal bleeding from the cervix was noted. The patient tolerated the procedure well. Routine post-procedure instructions were given to the patient.    A/P DUB        Uterine fibroids  EMBX completed today. TVH/BS reviewed with pt. Will schedule once [facility] returns. Continue with Aygestin in the mean time. F/I with post op appt

## 2018-04-25 ENCOUNTER — Encounter (HOSPITAL_COMMUNITY): Payer: Self-pay

## 2018-05-25 ENCOUNTER — Other Ambulatory Visit: Payer: Self-pay

## 2018-05-25 ENCOUNTER — Encounter (HOSPITAL_COMMUNITY): Payer: Self-pay

## 2018-05-25 ENCOUNTER — Emergency Department (HOSPITAL_COMMUNITY)
Admission: EM | Admit: 2018-05-25 | Discharge: 2018-05-25 | Disposition: A | Discharge status: Home / Self Care | Payer: Medicare HMO | Attending: Emergency Medicine | Admitting: Emergency Medicine

## 2018-05-25 DIAGNOSIS — R112 Nausea with vomiting, unspecified: Secondary | ICD-10-CM | POA: Diagnosis present

## 2018-05-25 DIAGNOSIS — J45909 Unspecified asthma, uncomplicated: Secondary | ICD-10-CM | POA: Insufficient documentation

## 2018-05-25 DIAGNOSIS — Z79899 Other long term (current) drug therapy: Secondary | ICD-10-CM | POA: Insufficient documentation

## 2018-05-25 DIAGNOSIS — R42 Dizziness and giddiness: Secondary | ICD-10-CM | POA: Insufficient documentation

## 2018-05-25 LAB — BASIC METABOLIC PANEL
Anion gap: 11 (ref 5–15)
BUN: 10 mg/dL (ref 6–20)
CHLORIDE: 102 mmol/L (ref 98–111)
CO2: 26 mmol/L (ref 22–32)
CREATININE: 0.88 mg/dL (ref 0.44–1.00)
Calcium: 9.5 mg/dL (ref 8.9–10.3)
GFR calc Af Amer: >60 mL/min (ref >60)
GFR calc non Af Amer: >60 mL/min (ref >60)
GLUCOSE: 112 mg/dL — AB (ref 70–99)
Potassium: 4 mmol/L (ref 3.5–5.1)
SODIUM: 139 mmol/L (ref 135–145)

## 2018-05-25 LAB — CBC WITH DIFFERENTIAL/PLATELET
Abs Immature Granulocytes: 0.02 10*3/uL (ref 0.00–0.07)
BASOS PCT: 0 %
Basophils Absolute: 0 10*3/uL (ref 0.0–0.1)
EOS PCT: 4 %
Eosinophils Absolute: 0.3 10*3/uL (ref 0.0–0.5)
HCT: 38.6 % (ref 36.0–46.0)
HEMOGLOBIN: 10.7 g/dL — AB (ref 12.0–15.0)
Immature Granulocytes: 0 %
Lymphocytes Relative: 25 %
Lymphs Abs: 2 10*3/uL (ref 0.7–4.0)
MCH: 20.3 pg — AB (ref 26.0–34.0)
MCHC: 27.7 g/dL — AB (ref 30.0–36.0)
MCV: 73.1 fL — ABNORMAL LOW (ref 80.0–100.0)
MONO ABS: 0.6 10*3/uL (ref 0.1–1.0)
Monocytes Relative: 8 %
Neutro Abs: 5 10*3/uL (ref 1.7–7.7)
Neutrophils Relative %: 63 %
Platelets: 460 10*3/uL — ABNORMAL HIGH (ref 150–400)
RBC: 5.28 MIL/uL — AB (ref 3.87–5.11)
RDW: 19.2 % — ABNORMAL HIGH (ref 11.5–15.5)
WBC: 7.9 10*3/uL (ref 4.0–10.5)
nRBC: 0 % (ref 0.0–0.2)

## 2018-05-25 MED ORDER — MECLIZINE HCL 25 MG PO TABS: 25.0000 mg | ORAL_TABLET | Freq: Three times a day (TID) | ORAL | 0 refills | Status: DC | PRN

## 2018-05-25 MED ORDER — MECLIZINE HCL 25 MG PO TABS
25.0000 mg | ORAL_TABLET | Freq: Once | ORAL | Status: AC
  Administered 2018-05-25: 25 mg via ORAL
  Filled 2018-05-25: qty 1

## 2018-05-25 NOTE — ED Triage Notes (Signed)
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

## 2018-05-25 NOTE — ED Provider Notes (Signed)
[facility] EMERGENCY DEPARTMENT Provider Note   CSN: [id number] Arrival date & time: 05/25/18  1700     History   Chief Complaint No chief complaint on file.   HPI Lynn Molina is a 47 y.o. female.  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  She presents today for evaluation of dizziness.  She reports going on a cruise with her mother from the second to 7 November.  Since she returned home, she has been experiencing dizziness.  She states that she feels as though the ground is moving when she is holding still.  It is worse when she ambulates and turns her head.  She denies any fevers or chills.  She denies any headaches or visual disturbances.  The history is provided by the patient.    Past Medical History:  Diagnosis Date  . Anemia    hx  . Asthma    rarely uses inhaler  . Asthma 09/11/2017  . Environmental allergies   . Environmental and seasonal allergies 09/11/2017  . Eye pain, right   . GERD (gastroesophageal reflux disease)    occasional but diet controlled, no med  . Head injury    Hammer fell on head - tx w lyrica  . Headache    tx with lyrica  . Morbid obesity with BMI of 40.0-44.9, adult ([place]) 09/11/2017  . Nerve damage    Head  . 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 SVD (spontaneous vaginal delivery)    x 2    Patient Active Problem List   Diagnosis Date Noted  . DUB (dysfunctional uterine bleeding) 04/19/2018  . Fibroids 04/19/2018  . 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 Asthma 09/11/2017  . Environmental and seasonal allergies 09/11/2017  . Morbid obesity with BMI of 40.0-44.9, adult ([place]) 09/11/2017  . Confusion 11/04/2016  . History of traumatic brain injury 11/04/2016  . Headaches due to old head injury 05/15/2013    Past Surgical History:  Procedure  Laterality Date  . DENTAL SURGERY     tooth ext with general anesthesia  . DILATATION & CURETTAGE/HYSTEROSCOPY WITH MYOSURE N/A 01/21/2016   Procedure: DILATATION & CURETTAGE/HYSTEROSCOPY WITH MYOSURE;  Surgeon: Aloha Gell, MD;  Location: [facility];  Service: Gynecology;  Laterality: N/A;  . TUBAL LIGATION    . WISDOM TOOTH EXTRACTION       OB History    Gravida  2   Para  2   Term  2   Preterm  0   AB  0   Living  2     SAB  0   TAB  0   Ectopic  0   Multiple  0   Live Births  2            Home Medications    Prior to Admission medications   Medication Sig Start Date End Date Taking? Authorizing Provider  acetaminophen (TYLENOL) 500 MG tablet Take 500 mg by mouth every 6 (six) hours as needed for mild pain, moderate pain or headache.     [provider]  albuterol (PROVENTIL HFA;VENTOLIN HFA) 108 (90 Base) MCG/ACT inhaler Inhale 2 puffs into the lungs every 6 (six) hours as needed for wheezing or shortness of breath. 09/07/17   Mack Hook, MD  Fe Cbn-Fe Gluc-FA-B12-C-DSS [facility] 90)  90-1 MG TABS Take 1 tablet by mouth daily. 04/04/18   Shelly Bombard, MD  ferrous sulfate 220 (44 Fe) MG/5ML solution Take 660 mg by mouth daily with breakfast.     [provider]  fexofenadine (ALLEGRA) 180 MG tablet Take 1 tablet (180 mg total) by mouth daily. 09/07/17   Mack Hook, MD  norethindrone (AYGESTIN) 5 MG tablet Take 2 tablets (10 mg total) by mouth daily. 04/04/18   Shelly Bombard, MD  pregabalin (LYRICA) 150 MG capsule Take 1 capsule (150 mg total) by mouth 3 (three) times daily. 09/07/17   Mack Hook, MD  Prenat-FePoly-Metf-FA-DHA-DSS (VITAFOL FE+) 90-1-200 & 50 MG CPPK Take 2 tablets by mouth daily before breakfast. 04/04/18   Shelly Bombard, MD  SYMBICORT 80-4.5 MCG/ACT inhaler 2 puffs twice daily 09/07/17   Mack Hook, MD  tranexamic acid (LYSTEDA) 650 MG TABS tablet Take 2 tablets (1,300 mg total) by mouth 3  (three) times daily. 09/07/17   Mack Hook, MD    Family History Family History  Problem Relation Age of Onset  . Breast cancer Mother   . Heart Problems Father   . Leukemia Brother   . Heart Problems Daughter   . Prostate cancer Paternal Grandfather   . Cancer Brother   . Diabetes Paternal Grandmother   . High blood pressure Paternal Grandmother   . Diabetes Maternal Grandmother   . High blood pressure Maternal Grandmother     Social History Social History   Tobacco Use  . Smoking status: Never Smoker  . Smokeless tobacco: Never Used  Substance Use Topics  . Alcohol use: Yes    Comment: occasional wine  . Drug use: No     Allergies   Tessalon [benzonatate] and Zyrtec [cetirizine hcl]   Review of Systems Review of Systems  All other systems reviewed and are negative.    Physical Exam Updated Vital Signs BP (!) 169/80 (BP Location: Right Wrist)   Pulse (!) 107   Temp 98.9 F (37.2 C) (Oral)   Resp 18   SpO2 100%   Physical Exam  Constitutional: She is oriented to person, place, and time. She appears well-developed and well-nourished. No distress.  HENT:  Head: Normocephalic and atraumatic.  Eyes: Pupils are equal, round, and reactive to light. EOM are normal.  Neck: Normal range of motion. Neck supple.  Cardiovascular: Normal rate and regular rhythm. Exam reveals no gallop and no friction rub.  No murmur heard. Pulmonary/Chest: Effort normal and breath sounds normal. No respiratory distress. She has no wheezes.  Abdominal: Soft. Bowel sounds are normal. She exhibits no distension. There is no tenderness.  Musculoskeletal: Normal range of motion.  Neurological: She is alert and oriented to person, place, and time. No cranial nerve deficit. She exhibits normal muscle tone. Coordination normal.  Skin: Skin is warm and dry. She is not diaphoretic.  Nursing note and vitals reviewed.    ED Treatments / Results  Labs (all labs ordered are listed,  but only abnormal results are displayed) Labs Reviewed  CBC WITH DIFFERENTIAL/PLATELET  BASIC METABOLIC PANEL    EKG None  Radiology No results found.  Procedures Procedures (including critical care time)  Medications Ordered in ED Medications  meclizine (ANTIVERT) tablet 25 mg (has no administration in time range)     Initial Impression / Assessment and Plan / ED Course  I have reviewed the triage vital signs and the nursing notes.  Pertinent labs & imaging results that were available during  my care of the patient were reviewed by me and considered in my medical decision making (see chart for details).  Patient presenting with complaints of dizziness.  This is worse when she ambulates and changes position.  She will recently went on a cruise and the symptoms started shortly afterward.  Her neurologic exam is nonfocal and laboratory studies are unremarkable.  She is feeling better after receiving meclizine in the ER.  At this point, I do not see any indication for imaging studies and feel that this is likely a peripheral vertigo.  She will be prescribed meclizine and is to follow-up as needed if not improving.  Final Clinical Impressions(s) / ED Diagnoses   Final diagnoses:  None    ED Discharge Orders    None       Veryl Speak, MD 05/25/18 1954

## 2018-05-25 NOTE — ED Notes (Signed)
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 Cannot speak, but can hear and write.

## 2018-05-25 NOTE — Discharge Instructions (Addendum)
Meclizine as prescribed.  Follow-up with your primary doctor if symptoms are not improving in the next 3 to 4 days, and return to the ER if symptoms significantly worsen or change.

## 2018-06-07 NOTE — Patient Instructions (Addendum)
Your procedure is scheduled on: Thursday, 12/19  Enter through the Main Entrance of [facility] at: 6 am  Pick up the phone at the desk and dial [phone].  Call this number if you have problems the morning of surgery: [phone].  Remember: Do NOT eat food or Do NOT drink clear liquids (including water) after midnight Wednesday.  Take these medicines the morning of surgery with a SIP OF WATER:  Pepcid, alavert, lyrica and sertraline.  Ok to use symbicort inhaler.  Brush your teeth on the day of surgery.  Bring your albuterol inhaler with you on day of surgery.  Stop herbal medications, vitamin supplements, Ibuprofen/NSAIDS 1 week prior to surgery- Thursday 06/15/18.  May use Tylenol or ex-strength tylenol if needed.  Do NOT wear jewelry (body piercing), metal hair clips/bobby pins, make-up, or nail polish. Do NOT wear lotions, powders, or perfumes.  You may wear deoderant. Do NOT shave for 48 hours prior to surgery. Do NOT bring valuables to the hospital.  Leave suitcase in car.  After surgery it may be brought to your room.  For patients admitted to the hospital, checkout time is 11:00 AM the day of discharge. Have a responsible adult drive you home and stay with you for 24 hours after your procedure.  Home with daughter [phone].

## 2018-06-12 ENCOUNTER — Other Ambulatory Visit: Payer: Self-pay

## 2018-06-12 ENCOUNTER — Encounter (HOSPITAL_COMMUNITY): Payer: Self-pay

## 2018-06-12 ENCOUNTER — Encounter (HOSPITAL_COMMUNITY)
Admission: RE | Admit: 2018-06-12 | Discharge: 2018-06-12 | Disposition: A | Discharge status: Home / Self Care | Payer: Medicare HMO | Source: Ambulatory Visit | Attending: Obstetrics and Gynecology | Admitting: Obstetrics and Gynecology

## 2018-06-12 DIAGNOSIS — Z01812 Encounter for preprocedural laboratory examination: Secondary | ICD-10-CM | POA: Insufficient documentation

## 2018-06-12 HISTORY — DX: Major depressive disorder, single episode, unspecified: F32.9

## 2018-06-12 HISTORY — DX: Depression, unspecified: F32.A

## 2018-06-12 HISTORY — DX: Unspecified speech disturbances: R47.9

## 2018-06-12 HISTORY — DX: Anxiety disorder, unspecified: F41.9

## 2018-06-12 HISTORY — DX: Unspecified osteoarthritis, unspecified site: M19.90

## 2018-06-12 LAB — CBC
HCT: 36.3 % (ref 36.0–46.0)
Hemoglobin: 10.8 g/dL — ABNORMAL LOW (ref 12.0–15.0)
MCH: 21.6 pg — ABNORMAL LOW (ref 26.0–34.0)
MCHC: 29.8 g/dL — ABNORMAL LOW (ref 30.0–36.0)
MCV: 72.6 fL — ABNORMAL LOW (ref 80.0–100.0)
NRBC: 0 % (ref 0.0–0.2)
PLATELETS: 432 10*3/uL — AB (ref 150–400)
RBC: 5 MIL/uL (ref 3.87–5.11)
RDW: 18.8 % — AB (ref 11.5–15.5)
WBC: 8.3 10*3/uL (ref 4.0–10.5)

## 2018-06-21 NOTE — H&P (Signed)
Lynn Molina is an 47 y.o. female G2P2 with know uterine fibroids and heavy cycles. Anemia secondary to her cycles. She has had a myosure resection but was unable to completely remove the fibroids. Has tried hormonal manipulation as well. Has continue to have heavy cycles. EMBX was negative. Pt desires definite therapy.   Last pap 1/19 NL TSVD x 2 ( largest 8#) BTL  Menstrual History: Menarche age: 89 Patient's last menstrual period was 05/31/2018 (exact date).    Past Medical History:  Diagnosis Date  . Anemia    hx  . Anxiety   . Arthritis    hips, lower  back, knees - no meds  . Asthma   . Depression   . Environmental allergies    tx with alavert and occasional allegra  . Environmental and seasonal allergies 09/11/2017  . Eye pain, right    06/2018-resolved, no longer a problem.  Marland Kitchen GERD (gastroesophageal reflux disease)   . Head injury    Hammer fell on head - tx w lyrica  . Headache    tx with lyrica  . Morbid obesity with BMI of 40.0-44.9, adult ([place]) 09/11/2017  . Nerve damage    Head  . Speech disorder    patient uses "speech easy" in her right ear for speech disorder, 06/2018-pt states she lost "speech easy"   . Speech impediment    r/t head injury-uses "Speech easy" but did not have it at PAT appt.  Will bring it on DOS.  . 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 SVD (spontaneous vaginal delivery)    x 2    Past Surgical History:  Procedure Laterality Date  . DENTAL SURGERY     tooth ext with general anesthesia  . DILATATION & CURETTAGE/HYSTEROSCOPY WITH MYOSURE N/A 01/21/2016   Procedure: DILATATION & CURETTAGE/HYSTEROSCOPY WITH MYOSURE;  Surgeon: Aloha Gell, MD;  Location: [facility];  Service: Gynecology;  Laterality: N/A;  . TUBAL LIGATION    . WISDOM TOOTH EXTRACTION      Family History  Problem Relation Age of Onset  . Breast cancer Mother   . Heart Problems Father   . Leukemia Brother   . Heart Problems Daughter   .  Prostate cancer Paternal Grandfather   . Cancer Brother   . Diabetes Paternal Grandmother   . High blood pressure Paternal Grandmother   . Diabetes Maternal Grandmother   . High blood pressure Maternal Grandmother     Social History:  reports that she has never smoked. She has never used smokeless tobacco. She reports previous alcohol use. She reports that she does not use drugs.  Allergies:  Allergies  Allergen Reactions  . Tessalon [Benzonatate] Anaphylaxis and Shortness Of Breath    Swollen face, throat swelling up  . Zyrtec [Cetirizine Hcl] Nausea And Vomiting    No medications prior to admission.    Review of Systems  Constitutional: Negative.   Respiratory: Negative.   Cardiovascular: Negative.   Gastrointestinal: Negative.   Genitourinary: Negative.     Last menstrual period 05/31/2018. Physical Exam  Constitutional: She appears well-developed and well-nourished.  Cardiovascular: Normal rate and regular rhythm.  Respiratory: Effort normal and breath sounds normal.  GI: Soft. Bowel sounds are normal.  Genitourinary:    Genitourinary Comments: Nl EGBUS Uterus 8-10 week size mobile non tender No adnexal masses     No results found for this or any previous visit (from the past 24 hour(s)).  No results found.  Assessment/Plan: Heavy cycles  Uterine fibroids Anemia secondary to above  TVH/BS reviewed with pt. R/B/post op care reviewed with pt. Pt verbalized understanding and desires to proceed.   Chancy Milroy 06/21/2018, 2:11 PM

## 2018-06-21 NOTE — Anesthesia Preprocedure Evaluation (Addendum)
Anesthesia Evaluation  Patient identified by MRN, date of birth, ID band Patient awake  General Assessment Comment:Daughter assists with pre-op evaluation due to patient speech impediment   Reviewed: Allergy & Precautions, Patient's Chart, lab work & pertinent test results  Airway Mallampati: III  TM Distance: >3 FB Neck ROM: Full    Dental no notable dental hx.    Pulmonary asthma ,    Pulmonary exam normal breath sounds clear to auscultation       Cardiovascular negative cardio ROS Normal cardiovascular exam Rhythm:Regular Rate:Normal     Neuro/Psych  Headaches, PSYCHIATRIC DISORDERS Anxiety Depression Speech impediment Head injury    GI/Hepatic Neg liver ROS, GERD  Medicated and Controlled,  Endo/Other  negative endocrine ROS  Renal/GU negative Renal ROS     Musculoskeletal negative musculoskeletal ROS (+)   Abdominal (+) + obese,   Peds  Hematology  (+) anemia ,   Anesthesia Other Findings DUB  Reproductive/Obstetrics hcg negative                            Anesthesia Physical Anesthesia Plan  ASA: III  Anesthesia Plan: General   Post-op Pain Management:    Induction: Intravenous  PONV Risk Score and Plan: 4 or greater and Ondansetron, Dexamethasone, Midazolam and Scopolamine patch - Pre-op  Airway Management Planned: Oral ETT  Additional Equipment:   Intra-op Plan:   Post-operative Plan: Extubation in OR  Informed Consent: I have reviewed the patients History and Physical, chart, labs and discussed the procedure including the risks, benefits and alternatives for the proposed anesthesia with the patient or authorized representative who has indicated his/her understanding and acceptance.   Dental advisory given  Plan Discussed with: CRNA  Anesthesia Plan Comments:        Anesthesia Quick Evaluation

## 2018-06-22 ENCOUNTER — Observation Stay (HOSPITAL_COMMUNITY): Payer: Medicare HMO | Admitting: Anesthesiology

## 2018-06-22 ENCOUNTER — Encounter (HOSPITAL_COMMUNITY)
Admission: RE | Disposition: A | Discharge status: Home / Self Care | Payer: Self-pay | Source: Home / Self Care | Attending: Obstetrics and Gynecology

## 2018-06-22 ENCOUNTER — Encounter (HOSPITAL_COMMUNITY): Payer: Self-pay | Admitting: *Deleted

## 2018-06-22 ENCOUNTER — Observation Stay (HOSPITAL_COMMUNITY)
Admission: RE | Admit: 2018-06-22 | Discharge: 2018-06-24 | Disposition: A | Discharge status: Home / Self Care | Payer: Medicare HMO | Attending: Obstetrics and Gynecology | Admitting: Obstetrics and Gynecology

## 2018-06-22 ENCOUNTER — Other Ambulatory Visit: Payer: Self-pay

## 2018-06-22 DIAGNOSIS — D259 Leiomyoma of uterus, unspecified: Secondary | ICD-10-CM | POA: Diagnosis not present

## 2018-06-22 DIAGNOSIS — Z803 Family history of malignant neoplasm of breast: Secondary | ICD-10-CM | POA: Insufficient documentation

## 2018-06-22 DIAGNOSIS — Z833 Family history of diabetes mellitus: Secondary | ICD-10-CM | POA: Diagnosis not present

## 2018-06-22 DIAGNOSIS — Z8249 Family history of ischemic heart disease and other diseases of the circulatory system: Secondary | ICD-10-CM | POA: Diagnosis not present

## 2018-06-22 DIAGNOSIS — Z791 Long term (current) use of non-steroidal anti-inflammatories (NSAID): Secondary | ICD-10-CM | POA: Insufficient documentation

## 2018-06-22 DIAGNOSIS — F419 Anxiety disorder, unspecified: Secondary | ICD-10-CM | POA: Insufficient documentation

## 2018-06-22 DIAGNOSIS — Z79899 Other long term (current) drug therapy: Secondary | ICD-10-CM | POA: Diagnosis not present

## 2018-06-22 DIAGNOSIS — D649 Anemia, unspecified: Secondary | ICD-10-CM | POA: Insufficient documentation

## 2018-06-22 DIAGNOSIS — K219 Gastro-esophageal reflux disease without esophagitis: Secondary | ICD-10-CM | POA: Diagnosis not present

## 2018-06-22 DIAGNOSIS — R471 Dysarthria and anarthria: Secondary | ICD-10-CM | POA: Diagnosis not present

## 2018-06-22 DIAGNOSIS — Z809 Family history of malignant neoplasm, unspecified: Secondary | ICD-10-CM | POA: Diagnosis not present

## 2018-06-22 DIAGNOSIS — N938 Other specified abnormal uterine and vaginal bleeding: Secondary | ICD-10-CM | POA: Insufficient documentation

## 2018-06-22 DIAGNOSIS — F329 Major depressive disorder, single episode, unspecified: Secondary | ICD-10-CM | POA: Insufficient documentation

## 2018-06-22 DIAGNOSIS — S069X0S Unspecified intracranial injury without loss of consciousness, sequela: Secondary | ICD-10-CM | POA: Insufficient documentation

## 2018-06-22 DIAGNOSIS — Z806 Family history of leukemia: Secondary | ICD-10-CM | POA: Diagnosis not present

## 2018-06-22 DIAGNOSIS — Z8042 Family history of malignant neoplasm of prostate: Secondary | ICD-10-CM | POA: Insufficient documentation

## 2018-06-22 DIAGNOSIS — M1990 Unspecified osteoarthritis, unspecified site: Secondary | ICD-10-CM | POA: Insufficient documentation

## 2018-06-22 DIAGNOSIS — R4789 Other speech disturbances: Secondary | ICD-10-CM | POA: Diagnosis not present

## 2018-06-22 DIAGNOSIS — Z6838 Body mass index (BMI) 38.0-38.9, adult: Secondary | ICD-10-CM | POA: Insufficient documentation

## 2018-06-22 DIAGNOSIS — Z9889 Other specified postprocedural states: Secondary | ICD-10-CM

## 2018-06-22 DIAGNOSIS — N920 Excessive and frequent menstruation with regular cycle: Secondary | ICD-10-CM | POA: Diagnosis present

## 2018-06-22 DIAGNOSIS — J45909 Unspecified asthma, uncomplicated: Secondary | ICD-10-CM | POA: Diagnosis not present

## 2018-06-22 DIAGNOSIS — X58XXXS Exposure to other specified factors, sequela: Secondary | ICD-10-CM | POA: Diagnosis not present

## 2018-06-22 HISTORY — PX: VAGINAL HYSTERECTOMY: SHX2639

## 2018-06-22 LAB — TYPE AND SCREEN
ABO/RH(D): A POS
Antibody Screen: NEGATIVE

## 2018-06-22 LAB — BASIC METABOLIC PANEL
Anion gap: 10 (ref 5–15)
BUN: 10 mg/dL (ref 6–20)
CHLORIDE: 102 mmol/L (ref 98–111)
CO2: 25 mmol/L (ref 22–32)
CREATININE: 0.71 mg/dL (ref 0.44–1.00)
Calcium: 9.1 mg/dL (ref 8.9–10.3)
GFR calc Af Amer: >60 mL/min (ref >60)
GFR calc non Af Amer: >60 mL/min (ref >60)
Glucose, Bld: 107 mg/dL — ABNORMAL HIGH (ref 70–99)
Potassium: 4 mmol/L (ref 3.5–5.1)
Sodium: 137 mmol/L (ref 135–145)

## 2018-06-22 LAB — HEMOGLOBIN AND HEMATOCRIT, BLOOD
HCT: 33.8 % — ABNORMAL LOW (ref 36.0–46.0)
HEMOGLOBIN: 10.3 g/dL — AB (ref 12.0–15.0)

## 2018-06-22 LAB — PREGNANCY, URINE: Preg Test, Ur: NEGATIVE

## 2018-06-22 SURGERY — HYSTERECTOMY, VAGINAL
Anesthesia: General

## 2018-06-22 MED ORDER — DEXAMETHASONE SODIUM PHOSPHATE 10 MG/ML IJ SOLN
INTRAMUSCULAR | Status: AC
  Filled 2018-06-22: qty 1

## 2018-06-22 MED ORDER — CELECOXIB 200 MG PO CAPS
400.0000 mg | ORAL_CAPSULE | ORAL | Status: AC
  Administered 2018-06-22: 400 mg via ORAL

## 2018-06-22 MED ORDER — PROPOFOL 10 MG/ML IV BOLUS
INTRAVENOUS | Status: DC | PRN
  Administered 2018-06-22: 150 mg via INTRAVENOUS

## 2018-06-22 MED ORDER — MOMETASONE FURO-FORMOTEROL FUM 100-5 MCG/ACT IN AERO
2.0000 | INHALATION_SPRAY | Freq: Two times a day (BID) | RESPIRATORY_TRACT | Status: DC
  Administered 2018-06-22 – 2018-06-24 (×4): 2 via RESPIRATORY_TRACT
  Filled 2018-06-22: qty 8.8

## 2018-06-22 MED ORDER — ONDANSETRON HCL 4 MG/2ML IJ SOLN
INTRAMUSCULAR | Status: DC | PRN
  Administered 2018-06-22: 4 mg via INTRAVENOUS

## 2018-06-22 MED ORDER — CEFAZOLIN SODIUM-DEXTROSE 2-4 GM/100ML-% IV SOLN
INTRAVENOUS | Status: AC
  Filled 2018-06-22: qty 100

## 2018-06-22 MED ORDER — CELECOXIB 200 MG PO CAPS
ORAL_CAPSULE | ORAL | Status: AC
  Administered 2018-06-22: 400 mg via ORAL
  Filled 2018-06-22: qty 2

## 2018-06-22 MED ORDER — SENNA 8.6 MG PO TABS
1.0000 | ORAL_TABLET | Freq: Two times a day (BID) | ORAL | Status: DC
  Administered 2018-06-22 – 2018-06-24 (×5): 8.6 mg via ORAL
  Filled 2018-06-22 (×7): qty 1

## 2018-06-22 MED ORDER — MIDAZOLAM HCL 5 MG/5ML IJ SOLN
INTRAMUSCULAR | Status: DC | PRN
  Administered 2018-06-22: 1 mg via INTRAVENOUS

## 2018-06-22 MED ORDER — ONDANSETRON HCL 4 MG/2ML IJ SOLN
INTRAMUSCULAR | Status: AC
  Filled 2018-06-22: qty 2

## 2018-06-22 MED ORDER — HYDROMORPHONE HCL 1 MG/ML IJ SOLN
INTRAMUSCULAR | Status: AC
  Administered 2018-06-22: 0.5 mg via INTRAVENOUS
  Filled 2018-06-22: qty 0.5

## 2018-06-22 MED ORDER — GABAPENTIN 300 MG PO CAPS
300.0000 mg | ORAL_CAPSULE | Freq: Once | ORAL | Status: AC
  Administered 2018-06-22: 300 mg via ORAL
  Filled 2018-06-22: qty 1

## 2018-06-22 MED ORDER — OXYCODONE-ACETAMINOPHEN 5-325 MG PO TABS
1.0000 | ORAL_TABLET | ORAL | Status: DC | PRN
  Administered 2018-06-22 – 2018-06-24 (×10): 2 via ORAL
  Filled 2018-06-22 (×9): qty 2

## 2018-06-22 MED ORDER — SUGAMMADEX SODIUM 200 MG/2ML IV SOLN
INTRAVENOUS | Status: DC | PRN
  Administered 2018-06-22: 200 mg via INTRAVENOUS

## 2018-06-22 MED ORDER — PROMETHAZINE HCL 25 MG/ML IJ SOLN: 6.2500 mg | INTRAMUSCULAR | Status: DC | PRN

## 2018-06-22 MED ORDER — PREGABALIN 75 MG PO CAPS
150.0000 mg | ORAL_CAPSULE | Freq: Three times a day (TID) | ORAL | Status: DC
  Administered 2018-06-22 – 2018-06-24 (×6): 150 mg via ORAL
  Filled 2018-06-22 (×6): qty 2

## 2018-06-22 MED ORDER — PANTOPRAZOLE SODIUM 40 MG PO TBEC
40.0000 mg | DELAYED_RELEASE_TABLET | Freq: Every day | ORAL | Status: DC
  Administered 2018-06-23 – 2018-06-24 (×2): 40 mg via ORAL
  Filled 2018-06-22 (×2): qty 1

## 2018-06-22 MED ORDER — SERTRALINE HCL 50 MG PO TABS
50.0000 mg | ORAL_TABLET | Freq: Every day | ORAL | Status: DC
  Administered 2018-06-22 – 2018-06-24 (×3): 50 mg via ORAL
  Filled 2018-06-22 (×5): qty 1

## 2018-06-22 MED ORDER — FENTANYL CITRATE (PF) 100 MCG/2ML IJ SOLN
INTRAMUSCULAR | Status: DC | PRN
  Administered 2018-06-22 (×4): 50 ug via INTRAVENOUS

## 2018-06-22 MED ORDER — IBUPROFEN 800 MG PO TABS: 800.0000 mg | ORAL_TABLET | Freq: Three times a day (TID) | ORAL | Status: DC

## 2018-06-22 MED ORDER — GABAPENTIN 400 MG PO CAPS
ORAL_CAPSULE | ORAL | Status: AC
  Filled 2018-06-22: qty 1

## 2018-06-22 MED ORDER — EPHEDRINE 5 MG/ML INJ
INTRAVENOUS | Status: AC
  Filled 2018-06-22: qty 10

## 2018-06-22 MED ORDER — HYDROMORPHONE HCL 1 MG/ML IJ SOLN
0.2500 mg | INTRAMUSCULAR | Status: DC | PRN
  Administered 2018-06-22 (×3): 0.5 mg via INTRAVENOUS

## 2018-06-22 MED ORDER — MIDAZOLAM HCL 2 MG/2ML IJ SOLN
INTRAMUSCULAR | Status: AC
  Filled 2018-06-22: qty 2

## 2018-06-22 MED ORDER — SOD CITRATE-CITRIC ACID 500-334 MG/5ML PO SOLN
30.0000 mL | ORAL | Status: AC
  Administered 2018-06-22: 30 mL via ORAL

## 2018-06-22 MED ORDER — PHENYLEPHRINE 40 MCG/ML (10ML) SYRINGE FOR IV PUSH (FOR BLOOD PRESSURE SUPPORT)
PREFILLED_SYRINGE | INTRAVENOUS | Status: AC
  Filled 2018-06-22: qty 10

## 2018-06-22 MED ORDER — FENTANYL CITRATE (PF) 100 MCG/2ML IJ SOLN
INTRAMUSCULAR | Status: AC
  Filled 2018-06-22: qty 2

## 2018-06-22 MED ORDER — SOD CITRATE-CITRIC ACID 500-334 MG/5ML PO SOLN
ORAL | Status: AC
  Filled 2018-06-22: qty 15

## 2018-06-22 MED ORDER — DEXAMETHASONE SODIUM PHOSPHATE 10 MG/ML IJ SOLN
INTRAMUSCULAR | Status: DC | PRN
  Administered 2018-06-22: 10 mg via INTRAVENOUS

## 2018-06-22 MED ORDER — KETOROLAC TROMETHAMINE 30 MG/ML IJ SOLN
INTRAMUSCULAR | Status: AC
  Filled 2018-06-22: qty 1

## 2018-06-22 MED ORDER — ROCURONIUM BROMIDE 100 MG/10ML IV SOLN
INTRAVENOUS | Status: DC | PRN
  Administered 2018-06-22: 50 mg via INTRAVENOUS

## 2018-06-22 MED ORDER — PROPOFOL 10 MG/ML IV BOLUS
INTRAVENOUS | Status: AC
  Filled 2018-06-22: qty 20

## 2018-06-22 MED ORDER — GABAPENTIN 300 MG PO CAPS
ORAL_CAPSULE | ORAL | Status: AC
  Administered 2018-06-22: 300 mg
  Filled 2018-06-22: qty 1

## 2018-06-22 MED ORDER — LACTATED RINGERS IV SOLN: INTRAVENOUS | Status: DC

## 2018-06-22 MED ORDER — KETOROLAC TROMETHAMINE 30 MG/ML IJ SOLN
INTRAMUSCULAR | Status: DC | PRN
  Administered 2018-06-22: 30 mg via INTRAVENOUS

## 2018-06-22 MED ORDER — ZOLPIDEM TARTRATE 5 MG PO TABS: 5.0000 mg | ORAL_TABLET | Freq: Every evening | ORAL | Status: DC | PRN

## 2018-06-22 MED ORDER — ONDANSETRON HCL 4 MG/2ML IJ SOLN: 4.0000 mg | Freq: Four times a day (QID) | INTRAMUSCULAR | Status: DC | PRN

## 2018-06-22 MED ORDER — SCOPOLAMINE 1 MG/3DAYS TD PT72
MEDICATED_PATCH | TRANSDERMAL | Status: AC
  Filled 2018-06-22: qty 1

## 2018-06-22 MED ORDER — KETOROLAC TROMETHAMINE 30 MG/ML IJ SOLN
30.0000 mg | Freq: Four times a day (QID) | INTRAMUSCULAR | Status: AC
  Administered 2018-06-22 – 2018-06-23 (×3): 30 mg via INTRAVENOUS
  Filled 2018-06-22 (×4): qty 1

## 2018-06-22 MED ORDER — 0.9 % SODIUM CHLORIDE (POUR BTL) OPTIME
TOPICAL | Status: DC | PRN
  Administered 2018-06-22: 1000 mL

## 2018-06-22 MED ORDER — LIDOCAINE HCL (CARDIAC) PF 100 MG/5ML IV SOSY
PREFILLED_SYRINGE | INTRAVENOUS | Status: AC
  Filled 2018-06-22: qty 5

## 2018-06-22 MED ORDER — ACETAMINOPHEN 500 MG PO TABS
1000.0000 mg | ORAL_TABLET | ORAL | Status: AC
  Administered 2018-06-22: 1000 mg via ORAL

## 2018-06-22 MED ORDER — LIDOCAINE HCL (CARDIAC) PF 100 MG/5ML IV SOSY
PREFILLED_SYRINGE | INTRAVENOUS | Status: DC | PRN
  Administered 2018-06-22: 100 mg via INTRAVENOUS

## 2018-06-22 MED ORDER — EPHEDRINE SULFATE 50 MG/ML IJ SOLN
INTRAMUSCULAR | Status: DC | PRN
  Administered 2018-06-22: 5 mg via INTRAVENOUS

## 2018-06-22 MED ORDER — SIMETHICONE 80 MG PO CHEW: 80.0000 mg | CHEWABLE_TABLET | Freq: Four times a day (QID) | ORAL | Status: DC | PRN

## 2018-06-22 MED ORDER — PHENYLEPHRINE HCL 10 MG/ML IJ SOLN
INTRAMUSCULAR | Status: DC | PRN
  Administered 2018-06-22: 40 ug via INTRAVENOUS
  Administered 2018-06-22: 80 ug via INTRAVENOUS
  Administered 2018-06-22 (×4): 40 ug via INTRAVENOUS

## 2018-06-22 MED ORDER — ONDANSETRON HCL 4 MG PO TABS
4.0000 mg | ORAL_TABLET | Freq: Four times a day (QID) | ORAL | Status: DC | PRN
  Administered 2018-06-23: 4 mg via ORAL
  Filled 2018-06-22: qty 1

## 2018-06-22 MED ORDER — ROCURONIUM BROMIDE 100 MG/10ML IV SOLN
INTRAVENOUS | Status: AC
  Filled 2018-06-22: qty 1

## 2018-06-22 MED ORDER — GABAPENTIN 300 MG PO CAPS: 900.0000 mg | ORAL_CAPSULE | ORAL | Status: DC

## 2018-06-22 MED ORDER — SCOPOLAMINE 1 MG/3DAYS TD PT72
1.0000 | MEDICATED_PATCH | Freq: Once | TRANSDERMAL | Status: DC
  Administered 2018-06-22: 1.5 mg via TRANSDERMAL

## 2018-06-22 MED ORDER — HYDROMORPHONE HCL 1 MG/ML IJ SOLN
1.0000 mg | INTRAMUSCULAR | Status: DC | PRN
  Administered 2018-06-22 (×2): 1 mg via INTRAVENOUS
  Filled 2018-06-22 (×4): qty 1

## 2018-06-22 MED ORDER — LACTATED RINGERS IV SOLN
INTRAVENOUS | Status: DC
  Administered 2018-06-22 (×3): via INTRAVENOUS

## 2018-06-22 MED ORDER — CEFAZOLIN SODIUM-DEXTROSE 2-4 GM/100ML-% IV SOLN
2.0000 g | INTRAVENOUS | Status: AC
  Administered 2018-06-22: 2 g via INTRAVENOUS

## 2018-06-22 MED ORDER — ACETAMINOPHEN 500 MG PO TABS
ORAL_TABLET | ORAL | Status: AC
  Administered 2018-06-22: 1000 mg via ORAL
  Filled 2018-06-22: qty 2

## 2018-06-22 MED ORDER — SUGAMMADEX SODIUM 200 MG/2ML IV SOLN
INTRAVENOUS | Status: AC
  Filled 2018-06-22: qty 2

## 2018-06-22 SURGICAL SUPPLY — 24 items
BLADE SURG 10 STRL SS (BLADE) ×3 IMPLANT
CANISTER SUCT 3000ML PPV (MISCELLANEOUS) ×3 IMPLANT
CONT PATH 16OZ SNAP LID 3702 (MISCELLANEOUS) IMPLANT
DECANTER SPIKE VIAL GLASS SM (MISCELLANEOUS) IMPLANT
GAUZE 4X4 16PLY RFD (DISPOSABLE) ×3 IMPLANT
GLOVE BIO SURGEON STRL SZ7.5 (GLOVE) ×3 IMPLANT
GLOVE BIO SURGEON STRL SZ8 (GLOVE) ×3 IMPLANT
GLOVE BIOGEL PI IND STRL 6.5 (GLOVE) ×1 IMPLANT
GLOVE BIOGEL PI IND STRL 7.0 (GLOVE) ×2 IMPLANT
GLOVE BIOGEL PI INDICATOR 6.5 (GLOVE) ×2
GLOVE BIOGEL PI INDICATOR 7.0 (GLOVE) ×4
GOWN STRL REUS W/TWL LRG LVL3 (GOWN DISPOSABLE) ×9 IMPLANT
GOWN STRL REUS W/TWL XL LVL3 (GOWN DISPOSABLE) ×3 IMPLANT
HIBICLENS CHG 4% 4OZ BTL (MISCELLANEOUS) ×3 IMPLANT
NS IRRIG 1000ML POUR BTL (IV SOLUTION) ×3 IMPLANT
PACK VAGINAL WOMENS (CUSTOM PROCEDURE TRAY) ×3 IMPLANT
PAD OB MATERNITY 4.3X12.25 (PERSONAL CARE ITEMS) ×3 IMPLANT
SUT VIC AB 2-0 CT1 18 (SUTURE) ×3 IMPLANT
SUT VIC AB 2-0 CT1 27 (SUTURE) ×3
SUT VIC AB 2-0 CT1 TAPERPNT 27 (SUTURE) ×1 IMPLANT
SUT VIC AB PLUS 45CM 1-MO-4 (SUTURE) ×6 IMPLANT
SUT VICRYL 1 TIES 12X18 (SUTURE) ×3 IMPLANT
TOWEL OR 17X24 6PK STRL BLUE (TOWEL DISPOSABLE) ×6 IMPLANT
TRAY FOLEY W/BAG SLVR 14FR (SET/KITS/TRAYS/PACK) ×3 IMPLANT

## 2018-06-22 NOTE — Anesthesia Postprocedure Evaluation (Signed)
Anesthesia Post Note  Patient: Lynn Molina  Procedure(s) Performed: HYSTERECTOMY VAGINAL     Patient location during evaluation: PACU Anesthesia Type: General Level of consciousness: awake and alert Pain management: pain level controlled Vital Signs Assessment: post-procedure vital signs reviewed and stable Respiratory status: spontaneous breathing, nonlabored ventilation, respiratory function stable and patient connected to nasal cannula oxygen Cardiovascular status: blood pressure returned to baseline and stable Postop Assessment: no apparent nausea or vomiting Anesthetic complications: no    Last Vitals:  Vitals:   06/22/18 1121 06/22/18 1244  BP: (!) 146/88 122/73  Pulse: 87 68  Resp: 18 18  Temp: 36.9 C 36.8 C  SpO2: 98% 99%    Last Pain:  Vitals:   06/22/18 1244  TempSrc: Oral  PainSc:    Pain Goal: Patients Stated Pain Goal: 3 (06/22/18 0637)               Karyl Kinnier Ellender

## 2018-06-22 NOTE — Op Note (Signed)
Lynn Molina PROCEDURE DATE: 06/22/2018  PREOPERATIVE DIAGNOSIS:  Symptomatic fibroids, menorrhagia POSTOPERATIVE DIAGNOSIS:  Symptomatic fibroids, menorrhagia SURGEON:   Chancy Milroy M.D., FACOG ASSISTANT: Emeterio Reeve, M.D. OPERATION:  Total Vaginal hysterectomy with morcellation ANESTHESIA:  General endotracheal.  INDICATIONS: The patient is a 47 y.o. [id number] with history of symptomatic uterine fibroids/menorrhagia. The patient made a decision to undergo definite surgical treatment. On the preoperative visit, the risks, benefits, indications, and alternatives of the procedure were reviewed with the patient.  On the day of surgery, the risks of surgery were again discussed with the patient including but not limited to: bleeding which may require transfusion or reoperation; infection which may require antibiotics; injury to bowel, bladder, ureters or other surrounding organs; need for additional procedures; thromboembolic phenomenon, incisional problems and other postoperative/anesthesia complications. Written informed consent was obtained.    OPERATIVE FINDINGS: A 10 week size uterus with several fibroids and normal tubes and ovaries bilaterally.  ESTIMATED BLOOD LOSS: 300 ml FLUIDS: As recorded URINE OUTPUT: As recorded SPECIMENS:  Uterus and cervix sent to pathology COMPLICATIONS:  None immediate.  DESCRIPTION OF PROCEDURE:  The patient received intravenous antibiotics and had sequential compression devices applied to her lower extremities while in the preoperative area.  She was then taken to the operating room where general anesthesia was administered and was found to be adequate. Foley cathter was placed. She was placed in the dorsal lithotomy position, and was prepped and draped in a sterile manner.  After an adequate timeout was performed, attention was turned to her pelvis.  A weighted speculum was then placed in the vagina, and the  posterior lip of the cervix were grasped  with  tenaculum. The posterior vagina was grasped and entered without problems. Long bill weighted speculum was placed. Attention was then directed to the anterior lip of the cervix which was grasped.  The cervix was then circumferentially incised, and the bladder was dissected off the pubocervical fascia without problems.  Ziplen clamps were then used to clamp the uterosacral ligaments on either side.  They were then cut and sutured ligated with 0 Vicryl.  Of note, all sutures used in this case were 0 Vicryl unless otherwise noted.   The cardinal ligaments were then clamped, cut and ligated.  The uterine vessels and broad ligaments were then serially clamped with the Ziplen clamps, cut, and suture ligated on both sides.  Excellent hemostasis was noted at this point.  Due to the size of the uterus, it was morcellated. The final pedicles bilaterally were clamped, cut secured with a free tie and in a Trosky fashion. The uterus was then removed and sent to pathology.  These pedicles were then suture ligated to ensure hemostasis. The fallopian tubes appeared to be normal but were not easliy amendable for removal. Thus the tubes were not removed.   All pedicles from the uterosacral ligament to the cornua were examined hemostasis was confirmed. The peritoneum was closed in a purse string fashion with 2/0 Vicryl. The vaginal cuff was closed with 2/0 Vicryl.  All instruments were then removed from the pelvis.  The patient tolerated the procedure well.  All instruments, needles, and sponge counts were correct x 2. The patient was taken to the recovery room in stable condition.   An experienced assistant was required given the standard of surgical care given the complexity of the case.  This assistant was needed for exposure, dissection, suctioning, retraction, and for overall help during the procedure.

## 2018-06-22 NOTE — Anesthesia Procedure Notes (Signed)
Procedure Name: Intubation Date/Time: 06/22/2018 7:50 AM Performed by: [place] Bohdan Macho C, CRNA Pre-anesthesia Checklist: Patient identified, Emergency Drugs available, Suction available, Patient being monitored and Timeout performed Patient Re-evaluated:Patient Re-evaluated prior to induction Oxygen Delivery Method: Circle system utilized Preoxygenation: Pre-oxygenation with 100% oxygen Induction Type: IV induction Ventilation: Oral airway inserted - appropriate to patient size and Mask ventilation without difficulty Laryngoscope Size: Mac and 3 Grade View: Grade II Tube type: Oral Tube size: 7.0 mm Number of attempts: 1 Airway Equipment and Method: Stylet Placement Confirmation: ETT inserted through vocal cords under direct vision,  breath sounds checked- equal and bilateral and positive ETCO2 Secured at: 23 cm Tube secured with: Tape Dental Injury: Teeth and Oropharynx as per pre-operative assessment

## 2018-06-22 NOTE — Progress Notes (Signed)
Around 1820 this evening, nurse tech heard a thud from the room. Nurse tech walked into room, found pt face down on floor (between the bed and the window).  Nurse tech called for assistance. Pt was assisted to bed, [facility] and Surgeon, Rip Harbour MD were notified of fall.  Family member was notified of fall.  From assessment - no injury was noticed.  Pt points to only her belly/surgical  when asked about her pain. Unsure of actual fall event due to pt limited verbal ability.

## 2018-06-22 NOTE — Transfer of Care (Signed)
Immediate Anesthesia Transfer of Care Note  Patient: Lynn Molina  Procedure(s) Performed: HYSTERECTOMY VAGINAL  Patient Location: PACU  Anesthesia Type:General  Level of Consciousness: awake, alert  and oriented  Airway & Oxygen Therapy: Patient Spontanous Breathing and Patient connected to nasal cannula oxygen  Post-op Assessment: Report given to RN and Post -op Vital signs reviewed and stable  Post vital signs: Reviewed and stable  Last Vitals:  Vitals Value Taken Time  BP 134/92 06/22/2018  9:36 AM  Temp    Pulse 94 06/22/2018  9:43 AM  Resp 18 06/22/2018  9:43 AM  SpO2 100 % 06/22/2018  9:43 AM  Vitals shown include unvalidated device data.  Last Pain:  Vitals:   06/22/18 0637  TempSrc: Oral  PainSc: 2       Patients Stated Pain Goal: 3 (16/10/96 0454)  Complications: No apparent anesthesia complications

## 2018-06-22 NOTE — Interval H&P Note (Signed)
History and Physical Interval Note:  06/22/2018 7:25 AM  Lynn Molina  has presented today for surgery, with the diagnosis of DUB  The various methods of treatment have been discussed with the patient and family. After consideration of risks, benefits and other options for treatment, the patient has consented to  Procedure(s): HYSTERECTOMY VAGINAL WITH SALPINGECTOMY (Bilateral) as a surgical intervention .  The patient's history has been reviewed, patient examined, no change in status, stable for surgery.  I have reviewed the patient's chart and labs.  Questions were answered to the patient's satisfaction.     Chancy Milroy

## 2018-06-23 ENCOUNTER — Encounter (HOSPITAL_COMMUNITY): Payer: Self-pay | Admitting: Obstetrics and Gynecology

## 2018-06-23 DIAGNOSIS — D259 Leiomyoma of uterus, unspecified: Secondary | ICD-10-CM

## 2018-06-23 DIAGNOSIS — N939 Abnormal uterine and vaginal bleeding, unspecified: Secondary | ICD-10-CM

## 2018-06-23 LAB — BASIC METABOLIC PANEL
Anion gap: 8 (ref 5–15)
BUN: 10 mg/dL (ref 6–20)
CO2: 25 mmol/L (ref 22–32)
Calcium: 8.8 mg/dL — ABNORMAL LOW (ref 8.9–10.3)
Chloride: 99 mmol/L (ref 98–111)
Creatinine, Ser: 1 mg/dL (ref 0.44–1.00)
GFR calc Af Amer: >60 mL/min (ref >60)
GFR calc non Af Amer: >60 mL/min (ref >60)
Glucose, Bld: 362 mg/dL — ABNORMAL HIGH (ref 70–99)
Potassium: 4.3 mmol/L (ref 3.5–5.1)
Sodium: 132 mmol/L — ABNORMAL LOW (ref 135–145)

## 2018-06-23 LAB — CBC
HCT: 30.6 % — ABNORMAL LOW (ref 36.0–46.0)
Hemoglobin: 9.3 g/dL — ABNORMAL LOW (ref 12.0–15.0)
MCH: 21.8 pg — ABNORMAL LOW (ref 26.0–34.0)
MCHC: 30.4 g/dL (ref 30.0–36.0)
MCV: 71.8 fL — ABNORMAL LOW (ref 80.0–100.0)
Platelets: 398 10*3/uL (ref 150–400)
RBC: 4.26 MIL/uL (ref 3.87–5.11)
RDW: 18.9 % — ABNORMAL HIGH (ref 11.5–15.5)
WBC: 13.4 10*3/uL — ABNORMAL HIGH (ref 4.0–10.5)
nRBC: 0 % (ref 0.0–0.2)

## 2018-06-23 MED ORDER — OXYCODONE-ACETAMINOPHEN 5-325 MG PO TABS: 1.0000 | ORAL_TABLET | ORAL | 0 refills | Status: DC | PRN

## 2018-06-23 MED ORDER — IBUPROFEN 800 MG PO TABS: 800.0000 mg | ORAL_TABLET | Freq: Three times a day (TID) | ORAL | 1 refills | Status: DC

## 2018-06-23 MED ORDER — IBUPROFEN 600 MG PO TABS
600.0000 mg | ORAL_TABLET | Freq: Four times a day (QID) | ORAL | Status: DC | PRN
  Filled 2018-06-23: qty 1

## 2018-06-23 MED ORDER — MENTHOL 3 MG MT LOZG
1.0000 | LOZENGE | OROMUCOSAL | Status: DC | PRN
  Administered 2018-06-23: 3 mg via ORAL
  Filled 2018-06-23: qty 9

## 2018-06-23 NOTE — Evaluation (Signed)
Physical Therapy Evaluation Patient Details Name: Lynn Molina MRN: [id number] DOB: 1970-10-26 Today's Date: 06/23/2018   History of Present Illness  Pt s/p TAH. Pt with fall post-op. PMH - TBI with residual speech deficit   Clinical Impression  Pt presents to PT with decr mobility due to feeling of weakness and expected post op soreness. Pt moves very slowly and hesitantly. Feel this if likely a functional deficit. Will follow up tomorrow with pt prior to pt returning home with family and equipment and follow up as recommended.     Follow Up Recommendations Home health PT    Equipment Recommendations  Rolling walker with 5" wheels    Recommendations for Other Services       Precautions / Restrictions Precautions Precautions: Fall Restrictions Weight Bearing Restrictions: No      Mobility  Bed Mobility Overal bed mobility: Needs Assistance Bed Mobility: Supine to Sit;Sit to Supine     Supine to sit: Min guard;HOB elevated Sit to supine: Min assist   General bed mobility comments: Incr time and verbal cues. Assist to bring legs back up into  bed  Transfers Overall transfer level: Needs assistance Equipment used: Rolling walker (2 wheeled) Transfers: Sit to/from Stand Sit to Stand: Min guard         General transfer comment: Incr time. Verbal cues for hand placement  Ambulation/Gait Ambulation/Gait assistance: Min guard Gait Distance (Feet): 40 Feet Assistive device: Rolling walker (2 wheeled) Gait Pattern/deviations: Step-through pattern;Decreased step length - right;Decreased step length - left;Trunk flexed Gait velocity: extremely slow Gait velocity interpretation: <1.31 ft/sec, indicative of household ambulator General Gait Details: Extremely slow, hesitant gait. Tremulous at times but no buckling or giving way or loss of balance. Pt closing eyes. Verbal cues to keep eyes open.  Stairs            Wheelchair Mobility    Modified Rankin (Stroke  Patients Only)       Balance Overall balance assessment: Needs assistance Sitting-balance support: No upper extremity supported;Feet supported Sitting balance-Leahy Scale: Good Sitting balance - Comments: pt hesitant to move   Standing balance support: Bilateral upper extremity supported Standing balance-Leahy Scale: Poor Standing balance comment: walker and supervision for static standing                             Pertinent Vitals/Pain Pain Assessment: Faces Faces Pain Scale: Hurts little more Pain Location: abdomen Pain Descriptors / Indicators: Guarding;Grimacing Pain Intervention(s): Monitored during session    Home Living Family/patient expects to be discharged to:: Private residence Living Arrangements: Children Available Help at Discharge: Family Type of Home: House Home Access: Stairs to enter Entrance Stairs-Rails: Right Entrance Stairs-Number of Steps: 10 Home Layout: One level Home Equipment: None      Prior Function Level of Independence: Independent               Hand Dominance        Extremity/Trunk Assessment   Upper Extremity Assessment Upper Extremity Assessment: Overall WFL for tasks assessed    Lower Extremity Assessment Lower Extremity Assessment: Generalized weakness(no focal weakness and questionable effort to testing)       Communication   Communication: Expressive difficulties(residual from TBI)  Cognition Arousal/Alertness: Awake/alert Behavior During Therapy: Anxious Overall Cognitive Status: No family/caregiver present to determine baseline cognitive functioning  General Comments      Exercises     Assessment/Plan    PT Assessment Patient needs continued PT services  PT Problem List Decreased mobility;Decreased knowledge of use of DME;Obesity       PT Treatment Interventions DME instruction;Gait training;Stair training;Functional mobility  training;Therapeutic activities;Therapeutic exercise;Balance training;Patient/family education    PT Goals (Current goals can be found in the Care Plan section)  Acute Rehab PT Goals Patient Stated Goal: return to prior level PT Goal Formulation: With patient Time For Goal Achievement: 07/07/18 Potential to Achieve Goals: Good    Frequency Min 3X/week   Barriers to discharge Inaccessible home environment stairs to enter home    Co-evaluation               AM-PAC PT "6 Clicks" Mobility  Outcome Measure Help needed turning from your back to your side while in a flat bed without using bedrails?: A Little Help needed moving from lying on your back to sitting on the side of a flat bed without using bedrails?: A Little Help needed moving to and from a bed to a chair (including a wheelchair)?: A Little Help needed standing up from a chair using your arms (e.g., wheelchair or bedside chair)?: A Little Help needed to walk in hospital room?: A Little Help needed climbing 3-5 steps with a railing? : A Lot 6 Click Score: 17    End of Session Equipment Utilized During Treatment: Gait belt Activity Tolerance: Patient tolerated treatment well Patient left: in bed;with call bell/phone within reach Nurse Communication: Mobility status PT Visit Diagnosis: Other abnormalities of gait and mobility (R26.89)    Time: 1416-1450 PT Time Calculation (min) (ACUTE ONLY): 34 min   Charges:   PT Evaluation $PT Eval Moderate Complexity: 1 Mod PT Treatments $Gait Training: 8-22 mins        [place] Pager [phone] Office [place] 06/23/2018, 3:20 PM

## 2018-06-23 NOTE — Progress Notes (Signed)
PT Lynn Molina has evaluated Lynn Molina's mobility. Dr Rip Harbour is notified of PT recommendations and request for home PT therapy at discharge. Dr Rip Harbour will call Dr Ilda Basset to place new orders. Notified Case Manager Terri Craft regarding home PT and walker at  discharge.

## 2018-06-23 NOTE — Progress Notes (Signed)
PT Lynn Molina is contacted regarding PT consult or and plans for patient discharge today. Orvil Feil will call back with further information.

## 2018-06-23 NOTE — Progress Notes (Signed)
Pt is transported via wheelchair & visited in her cousin's room 320 for hour.Visit was tolerated well. When Lynn Molina returned to her room she ambulated with her walker slowly and steadily from the bathroom to her bed.

## 2018-06-23 NOTE — Discharge Summary (Signed)
Physician Discharge Summary  Patient ID: Lynn Molina MRN: [id number] DOB/AGE: 46-Oct-1972 46 y.o.  Admit date: 06/22/2018 Discharge date: 06/23/2018  Admission Diagnoses: Heavy cycles and uterine fiborids  Discharge Diagnoses:  Active Problems:   Menorrhagia   Post-operative state   Discharged Condition: good  Hospital Course: Ms Lynn Molina was admitted with above Dx. She underwent TVH on 06/22/18 without problems. See OP note for additional information. Pt's post op course was unremarkable except she fell the evening of the surgery. She suffered no injury from the fall. She progressed to tolerating diet, voiding, passing flatus, ambulating and good oral pain control. On POD # 1 felt pt was amendable for discharge home. Discharge medications and instructions were reviewed with pt. Pt verbalized understanding.   Consults: None  Significant Diagnostic Studies: labs  Treatments: surgery: TVH  Discharge Exam: Blood pressure (!) 120/59, pulse 63, temperature (!) 97.5 F (36.4 C), temperature source Oral, resp. rate 18, height 5\' 3"  (1.6 m), weight 99.4 kg, last menstrual period 05/31/2018, SpO2 97 %. Lungs clear Heart RRR Abd soft + BS GU no bleeding  Ext non tender  Disposition: Discharge disposition: 01-Home or Self Care       Discharge Instructions    Call MD for:  difficulty breathing, headache or visual disturbances   Complete by:  As directed    Call MD for:  extreme fatigue   Complete by:  As directed    Call MD for:  hives   Complete by:  As directed    Call MD for:  persistant dizziness or light-headedness   Complete by:  As directed    Call MD for:  persistant nausea and vomiting   Complete by:  As directed    Call MD for:  redness, tenderness, or signs of infection (pain, swelling, redness, odor or green/yellow discharge around incision site)   Complete by:  As directed    Call MD for:  severe uncontrolled pain   Complete by:  As directed    Call MD for:   temperature >100.4   Complete by:  As directed    Diet - low sodium heart healthy   Complete by:  As directed    Increase activity slowly   Complete by:  As directed      Allergies as of 06/23/2018      Reactions   Tessalon [benzonatate] Anaphylaxis, Shortness Of Breath   Swollen face, throat swelling up   Zyrtec [cetirizine Hcl] Nausea And Vomiting      Medication List    STOP taking these medications   acetaminophen 500 MG tablet Commonly known as:  TYLENOL   meclizine 25 MG tablet Commonly known as:  ANTIVERT   VITAFOL FE+ 90-1-200 & 50 MG Cppk     TAKE these medications   albuterol 108 (90 Base) MCG/ACT inhaler Commonly known as:  PROVENTIL HFA;VENTOLIN HFA Inhale 2 puffs into the lungs every 6 (six) hours as needed for wheezing or shortness of breath.   famotidine 20 MG tablet Commonly known as:  PEPCID Take 20 mg by mouth daily as needed for heartburn or indigestion.   FERRALET 90 90-1 MG Tabs Take 1 tablet by mouth daily.   fexofenadine 180 MG tablet Commonly known as:  ALLEGRA Take 1 tablet (180 mg total) by mouth daily. What changed:    when to take this  reasons to take this   ibuprofen 800 MG tablet Commonly known as:  ADVIL,MOTRIN Take 1 tablet (800 mg total) by mouth  3 (three) times daily.   loratadine 10 MG tablet Commonly known as:  CLARITIN Take 10 mg by mouth daily.   MEDI-PATCH-LIDOCAINE EX Apply 1 patch topically as needed (pain).   oxyCODONE-acetaminophen 5-325 MG tablet Commonly known as:  PERCOCET/ROXICET Take 1 tablet by mouth every 4 (four) hours as needed for moderate pain.   pregabalin 150 MG capsule Commonly known as:  LYRICA Take 1 capsule (150 mg total) by mouth 3 (three) times daily.   sertraline 50 MG tablet Commonly known as:  ZOLOFT Take 50 mg by mouth daily.   SYMBICORT 80-4.5 MCG/ACT inhaler Generic drug:  budesonide-formoterol 2 puffs twice daily What changed:    how much to take  how to take  this  when to take this      Follow-up [place]. Schedule an appointment as soon as possible for a visit in 4 week(s).   Specialty:  Obstetrics and Gynecology Why:  Post op appt with dr. Gardiner Molina Contact information: [address] [place]          Signed: Chancy Molina 06/23/2018, 8:09 AM

## 2018-06-23 NOTE — Progress Notes (Signed)
Notified Dr Ilda Basset that patient had dry cough, clear lungs and coughed clear blood tinge mucous. Pt was entubated on 12/19 for surgery. Order received for cepacol lozenges. Dr Ilda Basset will notify Dr Rip Harbour.

## 2018-06-23 NOTE — Discharge Instructions (Signed)
Vaginal Hysterectomy, Care After  Refer to this sheet in the next few weeks. These instructions provide you with information about caring for yourself after your procedure. Your health care provider may also give you more specific instructions. Your treatment has been planned according to current medical practices, but problems sometimes occur. Call your health care provider if you have any problems or questions after your procedure.  What can I expect after the procedure?  After the procedure, it is common to have:  · Pain.  · Soreness and numbness in your incision areas.  · Vaginal bleeding and discharge.  · Constipation.  · Temporary problems emptying the bladder.  · Feelings of sadness or other emotions.  Follow these instructions at home:  Medicines  · Take over-the-counter and prescription medicines only as told by your health care provider.  · If you were prescribed an antibiotic medicine, take it as told by your health care provider. Do not stop taking the antibiotic even if you start to feel better.  · Do not drive or operate heavy machinery while taking prescription pain medicine.  Activity  · Return to your normal activities as told by your health care provider. Ask your health care provider what activities are safe for you.  · Get regular exercise as told by your health care provider. You may be told to take short walks every day and go farther each time.  · Do not lift anything that is heavier than 10 lb (4.5 kg).  General instructions    · Do not put anything in your vagina for 6 weeks after your surgery or as told by your health care provider. This includes tampons and douches.  · Do not have sex until your health care provider says you can.  · Do not take baths, swim, or use a hot tub until your health care provider approves.  · Drink enough fluid to keep your urine clear or pale yellow.  · Do not drive for 24 hours if you were given a sedative.  · Keep all follow-up visits as told by your health  care provider. This is important.  Contact a health care provider if:  · Your pain medicine is not helping.  · You have a fever.  · You have redness, swelling, or pain at your incision site.  · You have blood, pus, or a bad-smelling discharge from your vagina.  · You continue to have difficulty urinating.  Get help right away if:  · You have severe abdominal or back pain.  · You have heavy bleeding from your vagina.  · You have chest pain or shortness of breath.  This information is not intended to replace advice given to you by your health care provider. Make sure you discuss any questions you have with your health care provider.  Document Released: 10/13/2015 Document Revised: 11/27/2015 Document Reviewed: 07/06/2015  Elsevier Interactive Patient Education © 2019 [employer]

## 2018-06-24 DIAGNOSIS — D259 Leiomyoma of uterus, unspecified: Secondary | ICD-10-CM | POA: Diagnosis not present

## 2018-06-24 NOTE — Discharge Summary (Signed)
Addendum to discharge Summary from 06/23/18  Discharge was held yesterday d/t unsteady gait. PT consult was obtained. Home PT was arranged Pt reports that her legs feel stronger today. Has been seen by PT today. Home PT has been arranged and walker has been delivered to pt's home for her use.  She continues to report good oral pain control, tolerating diet, + flatus and voiding without problems.  PE no change   A/P No change Discharge home today with home PT. Medications, PT, post op instructions and follow up reviewed with pt and mother. Pt is discharged home today

## 2018-06-24 NOTE — Progress Notes (Signed)
Discharged home via w/c in stable condition.

## 2018-06-24 NOTE — Progress Notes (Signed)
Physical Therapy Treatment Patient Details Name: Lynn Molina MRN: [id number] DOB: December 25, 1970 Today's Date: 06/24/2018    History of Present Illness Pt s/p TAH. Pt with fall post-op. PMH - TBI with residual speech deficit     PT Comments    Pt looks pretty similar to her session yesterday, moving extremely slowly, relying on RW for support during gait.  She would continue to benefit from home therapy follow up at discharge.  PT will continue to follow acutely for safe mobility progression    Follow Up Recommendations  Home health PT     Equipment Recommendations  Rolling walker with 5" wheels(has been delivered to her room.)    Recommendations for Other Services   NA     Precautions / Restrictions Precautions Precautions: Fall Precaution Comments: h/o fall Restrictions Weight Bearing Restrictions: No    Mobility  Bed Mobility Overal bed mobility: Needs Assistance Bed Mobility: Supine to Sit;Sit to Supine     Supine to sit: HOB elevated;Supervision Sit to supine: Min assist   General bed mobility comments: Supervision to get EOB, extra time needed, pt prefered HOB elevated to rolling and used railing to assist.  Pt needed light assist to lift legs back into bed.   Transfers Overall transfer level: Needs assistance Equipment used: Rolling walker (2 wheeled) Transfers: Sit to/from Stand Sit to Stand: Min guard         General transfer comment: Min guard assist for safety, verbal cues for safe RW use/hand placement.   Ambulation/Gait Ambulation/Gait assistance: Min guard Gait Distance (Feet): 40 Feet Assistive device: Rolling walker (2 wheeled) Gait Pattern/deviations: Step-through pattern;Decreased step length - right;Decreased step length - left;Trunk flexed Gait velocity: extremely slow Gait velocity interpretation: <1.31 ft/sec, indicative of household ambulator(significantly less than 1.3 ft/sec) General Gait Details: Very slow, shaky gait, flexed  posture,  cues for longer step length and upright posture.  Gait speed puts her at high fall risk.  Not terribly unsteady, just slow.        Balance Overall balance assessment: Needs assistance Sitting-balance support: No upper extremity supported;Feet supported Sitting balance-Leahy Scale: Good Sitting balance - Comments: pt hesitant to move   Standing balance support: Bilateral upper extremity supported Standing balance-Leahy Scale: Poor Standing balance comment: relied heavily on UE support in standing on RW.                              Cognition Arousal/Alertness: Awake/alert Behavior During Therapy: Anxious Overall Cognitive Status: No family/caregiver present to determine baseline cognitive functioning                                 General Comments: Pt with h/o TBI and speech deficits at baseline.             Pertinent Vitals/Pain Pain Assessment: Faces Faces Pain Scale: Hurts whole lot Pain Location: internally in her genitals Pain Descriptors / Indicators: Guarding;Grimacing Pain Intervention(s): Limited activity within patient's tolerance;Monitored during session;Repositioned;Patient requesting pain meds-RN notified       Prior Function            PT Goals (current goals can now be found in the care plan section) Acute Rehab PT Goals Patient Stated Goal: get home safely, get back to her normal self, decrease pain Progress towards PT goals: Progressing toward goals    Frequency    Min 3X/week  PT Plan Current plan remains appropriate       AM-PAC PT "6 Clicks" Mobility   Outcome Measure  Help needed turning from your back to your side while in a flat bed without using bedrails?: A Little Help needed moving from lying on your back to sitting on the side of a flat bed without using bedrails?: A Little Help needed moving to and from a bed to a chair (including a wheelchair)?: A Little Help needed standing up from a  chair using your arms (e.g., wheelchair or bedside chair)?: A Little Help needed to walk in hospital room?: A Little Help needed climbing 3-5 steps with a railing? : A Lot 6 Click Score: 17    End of Session   Activity Tolerance: Patient limited by pain Patient left: in bed;with call bell/phone within reach;with bed alarm set Nurse Communication: Mobility status PT Visit Diagnosis: Other abnormalities of gait and mobility (R26.89)     Time: 2902-1115 PT Time Calculation (min) (ACUTE ONLY): 30 min  Charges:  $Gait Training: 23-37 mins                    [name] B. Dexter Sauser, PT, DPT  Acute Rehabilitation [phone] pager #[phone] office   06/24/2018, 9:09 AM

## 2018-06-26 ENCOUNTER — Encounter (HOSPITAL_COMMUNITY): Payer: Self-pay

## 2018-06-30 ENCOUNTER — Inpatient Hospital Stay (HOSPITAL_COMMUNITY)
Admission: AD | Admit: 2018-06-30 | Discharge: 2018-06-30 | Disposition: A | Discharge status: Home / Self Care | Payer: Medicare HMO | Attending: Obstetrics & Gynecology | Admitting: Obstetrics & Gynecology

## 2018-06-30 ENCOUNTER — Inpatient Hospital Stay (HOSPITAL_COMMUNITY): Payer: Medicare HMO

## 2018-06-30 ENCOUNTER — Encounter (HOSPITAL_COMMUNITY): Payer: Self-pay

## 2018-06-30 ENCOUNTER — Telehealth: Payer: Self-pay

## 2018-06-30 DIAGNOSIS — G8918 Other acute postprocedural pain: Secondary | ICD-10-CM | POA: Insufficient documentation

## 2018-06-30 DIAGNOSIS — R109 Unspecified abdominal pain: Secondary | ICD-10-CM | POA: Diagnosis present

## 2018-06-30 LAB — CBC
HCT: 29.8 % — ABNORMAL LOW (ref 36.0–46.0)
Hemoglobin: 8.7 g/dL — ABNORMAL LOW (ref 12.0–15.0)
MCH: 22 pg — AB (ref 26.0–34.0)
MCHC: 29.2 g/dL — AB (ref 30.0–36.0)
MCV: 75.4 fL — ABNORMAL LOW (ref 80.0–100.0)
Platelets: 405 10*3/uL — ABNORMAL HIGH (ref 150–400)
RBC: 3.95 MIL/uL (ref 3.87–5.11)
RDW: 18.7 % — ABNORMAL HIGH (ref 11.5–15.5)
WBC: 8.7 10*3/uL (ref 4.0–10.5)
nRBC: 0.7 % — ABNORMAL HIGH (ref 0.0–0.2)

## 2018-06-30 LAB — URINALYSIS, ROUTINE W REFLEX MICROSCOPIC
Bacteria, UA: NONE SEEN
Bilirubin Urine: NEGATIVE
Glucose, UA: NEGATIVE mg/dL
Hgb urine dipstick: NEGATIVE
Ketones, ur: NEGATIVE mg/dL
NITRITE: NEGATIVE
Protein, ur: NEGATIVE mg/dL
SPECIFIC GRAVITY, URINE: 1.016 (ref 1.005–1.030)
pH: 7 (ref 5.0–8.0)

## 2018-06-30 LAB — COMPREHENSIVE METABOLIC PANEL
ALT: 19 U/L (ref 0–44)
AST: 23 U/L (ref 15–41)
Albumin: 3.8 g/dL (ref 3.5–5.0)
Alkaline Phosphatase: 48 U/L (ref 38–126)
Anion gap: 8 (ref 5–15)
BUN: 8 mg/dL (ref 6–20)
CO2: 29 mmol/L (ref 22–32)
Calcium: 8.8 mg/dL — ABNORMAL LOW (ref 8.9–10.3)
Chloride: 99 mmol/L (ref 98–111)
Creatinine, Ser: 0.81 mg/dL (ref 0.44–1.00)
GFR calc non Af Amer: >60 mL/min (ref >60)
Glucose, Bld: 74 mg/dL (ref 70–99)
Potassium: 4.3 mmol/L (ref 3.5–5.1)
Sodium: 136 mmol/L (ref 135–145)
Total Bilirubin: 0.6 mg/dL (ref 0.3–1.2)
Total Protein: 7.7 g/dL (ref 6.5–8.1)

## 2018-06-30 LAB — TROPONIN I: Troponin I: <0.03 ng/mL (ref <0.03)

## 2018-06-30 LAB — D-DIMER, QUANTITATIVE: D-Dimer, Quant: 7.27 ug/mL-FEU — ABNORMAL HIGH (ref 0.00–0.50)

## 2018-06-30 MED ORDER — SIMETHICONE 80 MG PO CHEW
80.0000 mg | CHEWABLE_TABLET | Freq: Once | ORAL | Status: AC
  Administered 2018-06-30: 80 mg via ORAL
  Filled 2018-06-30: qty 1

## 2018-06-30 MED ORDER — HYDROMORPHONE HCL 1 MG/ML IJ SOLN
1.0000 mg | Freq: Once | INTRAMUSCULAR | Status: AC
  Administered 2018-06-30: 1 mg via INTRAMUSCULAR
  Filled 2018-06-30: qty 1

## 2018-06-30 MED ORDER — IOPAMIDOL (ISOVUE-370) INJECTION 76%
100.0000 mL | Freq: Once | INTRAVENOUS | Status: AC | PRN
  Administered 2018-06-30: 100 mL via INTRAVENOUS

## 2018-06-30 MED ORDER — OXYCODONE HCL 5 MG PO TABS: 5.0000 mg | ORAL_TABLET | ORAL | 0 refills | Status: DC | PRN

## 2018-06-30 MED ORDER — SIMETHICONE 80 MG PO CHEW: 80.0000 mg | CHEWABLE_TABLET | Freq: Four times a day (QID) | ORAL | 0 refills | Status: DC | PRN

## 2018-06-30 NOTE — MAU Provider Note (Signed)
Chief Complaint: Abdominal Pain; Dysuria; and Shortness of Breath   First Provider Initiated Contact with Patient 06/30/18 1300      SUBJECTIVE HPI: Lynn Molina is a 47 y.o. [id number] who is s/p TVH on 12/19 with medical hx significant for speech disorder r/t head injury in 2007 who presents to maternity admissions reporting abdominal pain not well managed by pain medicine at home. She reports pain in her mid and upper abdomen, radiating up into her chest. The pain is cramping and intermittent in her abdomen, but is sharp pain into her chest and left shoulder. The pain started with surgery but has been worsening in the last 24 hours. She is concerned that her fall in the hospital could have caused more injury in her abdomen.  She is taking oxycodone but has been cutting them in half and quarters so she will not run out.  Her chest pain is worse when she takes a deep breath and is worsening while in MAU.  There are no other symptoms. She has not tried any other treatments.    HPI  Past Medical History:  Diagnosis Date  . Anemia    hx  . Anxiety   . Arthritis    hips, lower  back, knees - no meds  . Asthma   . Depression   . Environmental allergies    tx with alavert and occasional allegra  . Environmental and seasonal allergies 09/11/2017  . Eye pain, right    06/2018-resolved, no longer a problem.  Marland Kitchen GERD (gastroesophageal reflux disease)   . Head injury    Hammer fell on head - tx w lyrica  . Headache    tx with lyrica  . Morbid obesity with BMI of 40.0-44.9, adult ([place]) 09/11/2017  . Nerve damage    Head  . Speech disorder    patient uses "speech easy" in her right ear for speech disorder, 06/2018-pt states she lost "speech easy"   . Speech impediment    r/t head injury-uses "Speech easy" but did not have it at PAT appt.  Will bring it on DOS.  . 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 SVD (spontaneous vaginal delivery)    x 2   Past Surgical History:   Procedure Laterality Date  . DENTAL SURGERY     tooth ext with general anesthesia  . DILATATION & CURETTAGE/HYSTEROSCOPY WITH MYOSURE N/A 01/21/2016   Procedure: DILATATION & CURETTAGE/HYSTEROSCOPY WITH MYOSURE;  Surgeon: Aloha Gell, MD;  Location: [facility];  Service: Gynecology;  Laterality: N/A;  . TUBAL LIGATION    . VAGINAL HYSTERECTOMY  06/22/2018   Procedure: HYSTERECTOMY VAGINAL;  Surgeon: Chancy Milroy, MD;  Location: [facility];  Service: Gynecology;;  . Arnetha Courser TOOTH EXTRACTION     Social History   Socioeconomic History  . Marital status: Single    Spouse name: Not on file  . Number of children: 2  . Years of education: cosmetology  . Highest education level: Not on file  Occupational History    Employer: OTHER    Comment: n/a  Social Needs  . Financial resource strain: Not on file  . Food insecurity:    Worry: Never true    Inability: Never true  . Transportation needs:    Medical: No    Non-medical: No  Tobacco Use  . Smoking status: Never Smoker  . Smokeless tobacco: Never Used  Substance and Sexual Activity  . Alcohol use: Not Currently  . Drug  use: No  . Sexual activity: Yes    Birth control/protection: Surgical    Comment: tubal ligation  Lifestyle  . Physical activity:    Days per week: Not on file    Minutes per session: Not on file  . Stress: Very much  Relationships  . Social connections:    Talks on phone: Not on file    Gets together: Not on file    Attends religious service: Not on file    Active member of club or organization: Not on file    Attends meetings of clubs or organizations: Not on file    Relationship status: Not on file  . Intimate partner violence:    Fear of current or ex partner: Not on file    Emotionally abused: Not on file    Physically abused: No    Forced sexual activity: Not on file  Other Topics Concern  . Not on file  Social History Narrative   Patient lives at home with daughter.   Caffeine Use: once or twice  weekly   No current facility-administered medications on file prior to encounter.    Current Outpatient Medications on File Prior to Encounter  Medication Sig Dispense Refill  . famotidine (PEPCID) 20 MG tablet Take 20 mg by mouth daily as needed for heartburn or indigestion.    . Fe Cbn-Fe Gluc-FA-B12-C-DSS (FERRALET 90) 90-1 MG TABS Take 1 tablet by mouth daily. 33 each 0  . loratadine (CLARITIN) 10 MG tablet Take 10 mg by mouth daily.    . pregabalin (LYRICA) 150 MG capsule Take 1 capsule (150 mg total) by mouth 3 (three) times daily. 90 capsule 11  . sertraline (ZOLOFT) 50 MG tablet Take 50 mg by mouth daily.   1  . SYMBICORT 80-4.5 MCG/ACT inhaler 2 puffs twice daily (Patient taking differently: Inhale 2 puffs into the lungs 2 (two) times daily. 2 puffs twice daily) 1 Inhaler 11  . albuterol (PROVENTIL HFA;VENTOLIN HFA) 108 (90 Base) MCG/ACT inhaler Inhale 2 puffs into the lungs every 6 (six) hours as needed for wheezing or shortness of breath. 1 Inhaler 1  . fexofenadine (ALLEGRA) 180 MG tablet Take 1 tablet (180 mg total) by mouth daily. (Patient not taking: Reported on 06/30/2018) 30 tablet 11  . ibuprofen (ADVIL,MOTRIN) 800 MG tablet Take 1 tablet (800 mg total) by mouth 3 (three) times daily. (Patient not taking: Reported on 06/30/2018) 30 tablet 1   Allergies  Allergen Reactions  . Tessalon [Benzonatate] Anaphylaxis and Shortness Of Breath    Swollen face, throat swelling up  . Zyrtec [Cetirizine Hcl] Nausea And Vomiting    ROS:  Review of Systems  Constitutional: Negative for chills, fatigue and fever.  Respiratory: Positive for chest tightness. Negative for shortness of breath.   Cardiovascular: Positive for chest pain.  Gastrointestinal: Positive for abdominal pain.  Genitourinary: Positive for pelvic pain. Negative for difficulty urinating, dysuria, flank pain, vaginal bleeding, vaginal discharge and vaginal pain.  Musculoskeletal: Positive for back pain.  Neurological:  Negative for dizziness and headaches.  Psychiatric/Behavioral: Negative.      I have reviewed patient's Past Medical Hx, Surgical Hx, Family Hx, Social Hx, medications and allergies.   Physical Exam   Patient Vitals for the past 24 hrs:  BP Temp Temp src Pulse Resp SpO2  06/30/18 1649 (!) 148/80 99.3 F (37.4 C) Oral (!) 104 20 98 %  06/30/18 1220 (!) 152/72 99.7 F (37.6 C) - (!) 107 (!) 21 97 %  Constitutional: Well-developed, well-nourished female in no acute distress.  Cardiovascular: normal rate Respiratory: normal effort GI: Abd soft, generalized pain throughout abdomen, no abnormalities palpable.  Pos BS x 4 MS: Extremities nontender, no edema, normal ROM Neurologic: Alert and oriented x 4.  GU: Neg CVAT.  PELVIC EXAM: Deferred  LAB RESULTS Results for orders placed or performed during the hospital encounter of 06/30/18 (from the past 24 hour(s))  Troponin I - Once     Status: None   Collection Time: 06/30/18  1:26 PM  Result Value Ref Range   Troponin I <0.03 <0.03 ng/mL  D-dimer, quantitative (not at [facility])     Status: Abnormal   Collection Time: 06/30/18  1:26 PM  Result Value Ref Range   D-Dimer, Quant 7.27 (H) 0.00 - 0.50 ug/mL-FEU  CBC     Status: Abnormal   Collection Time: 06/30/18  1:26 PM  Result Value Ref Range   WBC 8.7 4.0 - 10.5 K/uL   RBC 3.95 3.87 - 5.11 MIL/uL   Hemoglobin 8.7 (L) 12.0 - 15.0 g/dL   HCT 29.8 (L) 36.0 - 46.0 %   MCV 75.4 (L) 80.0 - 100.0 fL   MCH 22.0 (L) 26.0 - 34.0 pg   MCHC 29.2 (L) 30.0 - 36.0 g/dL   RDW 18.7 (H) 11.5 - 15.5 %   Platelets 405 (H) 150 - 400 K/uL   nRBC 0.7 (H) 0.0 - 0.2 %  Comprehensive metabolic panel     Status: Abnormal   Collection Time: 06/30/18  1:26 PM  Result Value Ref Range   Sodium 136 135 - 145 mmol/L   Potassium 4.3 3.5 - 5.1 mmol/L   Chloride 99 98 - 111 mmol/L   CO2 29 22 - 32 mmol/L   Glucose, Bld 74 70 - 99 mg/dL   BUN 8 6 - 20 mg/dL   Creatinine, Ser 0.81 0.44 - 1.00 mg/dL   Calcium  8.8 (L) 8.9 - 10.3 mg/dL   Total Protein 7.7 6.5 - 8.1 g/dL   Albumin 3.8 3.5 - 5.0 g/dL   AST 23 15 - 41 U/L   ALT 19 0 - 44 U/L   Alkaline Phosphatase 48 38 - 126 U/L   Total Bilirubin 0.6 0.3 - 1.2 mg/dL   GFR calc non Af Amer >60 >60 mL/min   GFR calc Af Amer >60 >60 mL/min   Anion gap 8 5 - 15  Urinalysis, Routine w reflex microscopic     Status: Abnormal   Collection Time: 06/30/18  1:45 PM  Result Value Ref Range   Color, Urine YELLOW YELLOW   APPearance CLEAR CLEAR   Specific Gravity, Urine 1.016 1.005 - 1.030   pH 7.0 5.0 - 8.0   Glucose, UA NEGATIVE NEGATIVE mg/dL   Hgb urine dipstick NEGATIVE NEGATIVE   Bilirubin Urine NEGATIVE NEGATIVE   Ketones, ur NEGATIVE NEGATIVE mg/dL   Protein, ur NEGATIVE NEGATIVE mg/dL   Nitrite NEGATIVE NEGATIVE   Leukocytes, UA TRACE (A) NEGATIVE   RBC / HPF 0-5 0 - 5 RBC/hpf   WBC, UA 0-5 0 - 5 WBC/hpf   Bacteria, UA NONE SEEN NONE SEEN   Squamous Epithelial / LPF 0-5 0 - 5   Mucus PRESENT     --/--/A POS (12/19 0610)  IMAGING Ct Angio Chest Pe W Or Wo Contrast  Result Date: 06/30/2018 CLINICAL DATA:  Frequent urination. Pain with urination. Chest pain with deep breath. Shortness of breath. Recent hysterectomy. EXAM: CT ANGIOGRAPHY CHEST CT ABDOMEN  AND PELVIS WITH CONTRAST TECHNIQUE: Multidetector CT imaging of the chest was performed using the standard protocol during bolus administration of intravenous contrast. Multiplanar CT image reconstructions and MIPs were obtained to evaluate the vascular anatomy. Multidetector CT imaging of the abdomen and pelvis was performed using the standard protocol during bolus administration of intravenous contrast. CONTRAST:  131mL ISOVUE-370 IOPAMIDOL (ISOVUE-370) INJECTION 76% COMPARISON:  None. FINDINGS: CTA CHEST FINDINGS Cardiovascular: Satisfactory opacification of the pulmonary arteries to the segmental level. No evidence of pulmonary embolism. Mild cardiac enlargement. No pericardial effusion.  Mediastinum/Nodes: Normal appearance of the thyroid gland. The trachea appears patent and is midline. Normal appearance of the esophagus. No enlarged axillary, supraclavicular, mediastinal or hilar lymph nodes. Lungs/Pleura: Areas of subsegmental atelectasis noted in the left base. No pneumothorax or airspace consolidation. Musculoskeletal: No chest wall abnormality. No acute or significant osseous findings. Review of the MIP images confirms the above findings. CT ABDOMEN and PELVIS FINDINGS Hepatobiliary: Focal area of low attenuation within the central liver is indeterminate measuring 1.5 cm, image 15/2. Liver otherwise unremarkable. Gallbladder negative. No biliary ductal dilatation. Pancreas: Unremarkable. No pancreatic ductal dilatation or surrounding inflammatory changes. Spleen: Normal in size without focal abnormality. Adrenals/Urinary Tract: Normal appearance of the adrenal glands. The kidneys are unremarkable. No hydronephrosis or mass. Urinary bladder appears normal. Stomach/Bowel: Stomach appears normal. The small bowel loops have a normal caliber without evidence to suggest bowel obstruction. The appendix is visualized and appears normal. No pathologic dilatation of the colon. Vascular/Lymphatic: Normal appearance of the abdominal aorta. No enlarged lymph nodes identified within the abdomen or pelvis. No inguinal adenopathy. Reproductive: Status post hysterectomy. Other: There is high attenuation fluid identified within the distal pericolic gutters bilaterally which extends into bilateral iliac fossa and pelvis. Likely hemoperitoneum. Musculoskeletal: No acute or significant osseous findings. Review of the MIP images confirms the above findings. IMPRESSION: 1. No evidence for acute pulmonary emboli. 2. Subsegmental atelectasis noted within the left lung base. 3. Postoperative changes from recent hysterectomy. 4. There is high attenuation fluid within the lower abdomen and pelvis. Likely representing  hemoperitoneum. If the patient is hemodynamically stable then this is favored to represent sequelae of recent surgery. Clinical correlation is advised. 5. Focal low attenuation structure within central liver, indeterminate. Consider more definitive assessment with followup, nonemergent contrast enhanced MR of the liver. Electronically Signed   By: Kerby Moors M.D.   On: 06/30/2018 16:15   Ct Abdomen Pelvis W Contrast  Result Date: 06/30/2018 CLINICAL DATA:  Frequent urination. Pain with urination. Chest pain with deep breath. Shortness of breath. Recent hysterectomy. EXAM: CT ANGIOGRAPHY CHEST CT ABDOMEN AND PELVIS WITH CONTRAST TECHNIQUE: Multidetector CT imaging of the chest was performed using the standard protocol during bolus administration of intravenous contrast. Multiplanar CT image reconstructions and MIPs were obtained to evaluate the vascular anatomy. Multidetector CT imaging of the abdomen and pelvis was performed using the standard protocol during bolus administration of intravenous contrast. CONTRAST:  163mL ISOVUE-370 IOPAMIDOL (ISOVUE-370) INJECTION 76% COMPARISON:  None. FINDINGS: CTA CHEST FINDINGS Cardiovascular: Satisfactory opacification of the pulmonary arteries to the segmental level. No evidence of pulmonary embolism. Mild cardiac enlargement. No pericardial effusion. Mediastinum/Nodes: Normal appearance of the thyroid gland. The trachea appears patent and is midline. Normal appearance of the esophagus. No enlarged axillary, supraclavicular, mediastinal or hilar lymph nodes. Lungs/Pleura: Areas of subsegmental atelectasis noted in the left base. No pneumothorax or airspace consolidation. Musculoskeletal: No chest wall abnormality. No acute or significant osseous findings. Review of the MIP  images confirms the above findings. CT ABDOMEN and PELVIS FINDINGS Hepatobiliary: Focal area of low attenuation within the central liver is indeterminate measuring 1.5 cm, image 15/2. Liver  otherwise unremarkable. Gallbladder negative. No biliary ductal dilatation. Pancreas: Unremarkable. No pancreatic ductal dilatation or surrounding inflammatory changes. Spleen: Normal in size without focal abnormality. Adrenals/Urinary Tract: Normal appearance of the adrenal glands. The kidneys are unremarkable. No hydronephrosis or mass. Urinary bladder appears normal. Stomach/Bowel: Stomach appears normal. The small bowel loops have a normal caliber without evidence to suggest bowel obstruction. The appendix is visualized and appears normal. No pathologic dilatation of the colon. Vascular/Lymphatic: Normal appearance of the abdominal aorta. No enlarged lymph nodes identified within the abdomen or pelvis. No inguinal adenopathy. Reproductive: Status post hysterectomy. Other: There is high attenuation fluid identified within the distal pericolic gutters bilaterally which extends into bilateral iliac fossa and pelvis. Likely hemoperitoneum. Musculoskeletal: No acute or significant osseous findings. Review of the MIP images confirms the above findings. IMPRESSION: 1. No evidence for acute pulmonary emboli. 2. Subsegmental atelectasis noted within the left lung base. 3. Postoperative changes from recent hysterectomy. 4. There is high attenuation fluid within the lower abdomen and pelvis. Likely representing hemoperitoneum. If the patient is hemodynamically stable then this is favored to represent sequelae of recent surgery. Clinical correlation is advised. 5. Focal low attenuation structure within central liver, indeterminate. Consider more definitive assessment with followup, nonemergent contrast enhanced MR of the liver. Electronically Signed   By: Kerby Moors M.D.   On: 06/30/2018 16:15    MAU Management/MDM: Ordered labs and reviewed results.  D-Dimer elevated so CT angio ordered with results showing no evidence of PE.  Pt chest pain varies intermittently, c/w gas pain so simethicone given in MAU.  Abdominal  pain is also generalized, no acute abdomen on exam but significant pain with exam so abdominal CT.  Dilaudid 1 mg IM given with improved pain.  No acute abnormalities on CT.  Some hemiperitoneum c/w surgery.  Consult Dr Harolyn Rutherford with assessment and findings.  Pain management with oxycodone 5 mg Q 4 hours PRN x 20 tabs and simethecone OTC PRN.  F/U with Dr Rip Harbour for postop visit as scheduled. Return to MAU as needed for emergencies.  Pt discharged with strict return precautions.  ASSESSMENT 1. Postoperative pain   2. Abdominal pain in female patient     PLAN Discharge home Allergies as of 06/30/2018      Reactions   Tessalon [benzonatate] Anaphylaxis, Shortness Of Breath   Swollen face, throat swelling up   Zyrtec [cetirizine Hcl] Nausea And Vomiting      Medication List    STOP taking these medications   fexofenadine 180 MG tablet Commonly known as:  ALLEGRA   ibuprofen 800 MG tablet Commonly known as:  ADVIL,MOTRIN     TAKE these medications   albuterol 108 (90 Base) MCG/ACT inhaler Commonly known as:  PROVENTIL HFA;VENTOLIN HFA Inhale 2 puffs into the lungs every 6 (six) hours as needed for wheezing or shortness of breath.   famotidine 20 MG tablet Commonly known as:  PEPCID Take 20 mg by mouth daily as needed for heartburn or indigestion.   FERRALET 90 90-1 MG Tabs Take 1 tablet by mouth daily.   loratadine 10 MG tablet Commonly known as:  CLARITIN Take 10 mg by mouth daily.   oxyCODONE 5 MG immediate release tablet Commonly known as:  Oxy IR/ROXICODONE Take 1 tablet (5 mg total) by mouth every 4 (four) hours as needed  for severe pain.   pregabalin 150 MG capsule Commonly known as:  LYRICA Take 1 capsule (150 mg total) by mouth 3 (three) times daily.   sertraline 50 MG tablet Commonly known as:  ZOLOFT Take 50 mg by mouth daily.   simethicone 80 MG chewable tablet Commonly known as:  GAS-X Chew 1 tablet (80 mg total) by mouth every 6 (six) hours as needed for  flatulence.   SYMBICORT 80-4.5 MCG/ACT inhaler Generic drug:  budesonide-formoterol 2 puffs twice daily What changed:    how much to take  how to take this  when to take this      [place] for [place] Follow up.   Specialty:  Obstetrics and Gynecology Why:  As scheduled for postop appointment. Return to MAU as needed for emergencies. Contact information: [address] [place] Certified Nurse-Midwife 06/30/2018  6:20 PM

## 2018-06-30 NOTE — MAU Note (Signed)
Pt reports s/p hysterectomy on 12/19, since surgery she has had pain with urination, generalized abd pain , hurts to take a deep breath.

## 2018-06-30 NOTE — Telephone Encounter (Signed)
TC from physical therapist Amber stating patient is in a lot of pain B/P: 144/84 P: 105 And frequent urination. She stated pt expresses a lot of discomfort and pain Due to faculty physician gone for the day pt advised to go to [facility] for evaluation for assurance.

## 2018-07-03 ENCOUNTER — Other Ambulatory Visit: Payer: Self-pay | Admitting: Obstetrics

## 2018-07-03 DIAGNOSIS — N939 Abnormal uterine and vaginal bleeding, unspecified: Secondary | ICD-10-CM

## 2018-07-06 ENCOUNTER — Encounter: Payer: Self-pay | Admitting: *Deleted

## 2018-07-06 NOTE — Progress Notes (Unsigned)
Received fax from [facility]ie stating there is a delay in start of care for Home Health aide services until 07/11/18. Will forward to Dr.Ervin who fax is directed to.  Also notified Dr. Ihor Dow Attending.

## 2018-07-08 ENCOUNTER — Other Ambulatory Visit (HOSPITAL_COMMUNITY): Payer: Self-pay | Admitting: Obstetrics and Gynecology

## 2018-07-18 ENCOUNTER — Telehealth: Payer: Self-pay

## 2018-07-18 NOTE — Telephone Encounter (Signed)
error 

## 2018-07-19 ENCOUNTER — Other Ambulatory Visit: Payer: Self-pay

## 2018-07-19 ENCOUNTER — Telehealth: Payer: Self-pay

## 2018-07-19 DIAGNOSIS — G8918 Other acute postprocedural pain: Secondary | ICD-10-CM

## 2018-07-19 MED ORDER — IBUPROFEN 800 MG PO TABS: 800.0000 mg | ORAL_TABLET | Freq: Three times a day (TID) | ORAL | 1 refills | Status: DC | PRN

## 2018-07-19 NOTE — Telephone Encounter (Signed)
PT Orders signed and faxed back to [facility]

## 2018-07-20 ENCOUNTER — Encounter: Payer: Self-pay | Admitting: *Deleted

## 2018-08-02 ENCOUNTER — Telehealth: Payer: Self-pay

## 2018-08-02 NOTE — Telephone Encounter (Signed)
Returned call to [employer] from [employer]. Left detailed vm stating that provider said that pt needs to follow up with PCP about the black stools she has been having.

## 2018-09-06 ENCOUNTER — Ambulatory Visit (INDEPENDENT_AMBULATORY_CARE_PROVIDER_SITE_OTHER): Payer: Medicare HMO | Admitting: Obstetrics and Gynecology

## 2018-09-06 ENCOUNTER — Encounter: Payer: Self-pay | Admitting: Obstetrics and Gynecology

## 2018-09-06 ENCOUNTER — Encounter (INDEPENDENT_AMBULATORY_CARE_PROVIDER_SITE_OTHER): Payer: Self-pay

## 2018-09-06 VITALS — BP 119/78 | HR 92 | Wt 224.1 lb

## 2018-09-06 DIAGNOSIS — Z9889 Other specified postprocedural states: Secondary | ICD-10-CM

## 2018-09-06 NOTE — Progress Notes (Signed)
Patient ID: Lynn Molina, female   DOB: Nov 23, 1970, 48 y.o.   MRN: [id number] Lynn Molina is here for post op visit from [facility] in Jan Pathology reviewed with pt She has no complaints today. Ambulating, voiding, tolerating diet without problems.  PE AF VSS Lungs clear Heart RRR Abd soft + BS GU Nl EGBUS cuff well healed cervix and uterus absent, no masses or tenderness  A/P Post Op TVH Doing well. Return to ADL's as tolerates F/U in 1 yr or PRN

## 2018-09-06 NOTE — Progress Notes (Signed)
Pt presents for post op s/p TVH w/ morcellation.  Pt overall is doing well.

## 2018-09-06 NOTE — Patient Instructions (Signed)

## 2018-09-26 ENCOUNTER — Other Ambulatory Visit: Payer: Self-pay | Admitting: Internal Medicine

## 2019-03-20 ENCOUNTER — Other Ambulatory Visit: Payer: Self-pay | Admitting: Physician Assistant

## 2019-03-20 DIAGNOSIS — Z1231 Encounter for screening mammogram for malignant neoplasm of breast: Secondary | ICD-10-CM

## 2019-04-03 ENCOUNTER — Other Ambulatory Visit: Payer: Self-pay

## 2019-04-03 DIAGNOSIS — I83899 Varicose veins of unspecified lower extremities with other complications: Secondary | ICD-10-CM

## 2019-04-04 ENCOUNTER — Telehealth (HOSPITAL_COMMUNITY): Payer: Self-pay

## 2019-04-04 NOTE — Telephone Encounter (Signed)
Voicemail was left for patient including appointment time and information and instructions regarding COVID-19 safety procedures including mask usage, rescheduling if sympotmatic, and limited visitors in testing area.  

## 2019-04-05 ENCOUNTER — Ambulatory Visit (INDEPENDENT_AMBULATORY_CARE_PROVIDER_SITE_OTHER): Payer: Medicare HMO | Admitting: Vascular Surgery

## 2019-04-05 ENCOUNTER — Encounter: Payer: Self-pay | Admitting: Vascular Surgery

## 2019-04-05 ENCOUNTER — Other Ambulatory Visit: Payer: Self-pay | Admitting: Obstetrics

## 2019-04-05 ENCOUNTER — Ambulatory Visit (HOSPITAL_COMMUNITY)
Admission: RE | Admit: 2019-04-05 | Discharge: 2019-04-05 | Disposition: A | Discharge status: Home / Self Care | Payer: Medicare HMO | Source: Ambulatory Visit | Attending: Family | Admitting: Family

## 2019-04-05 ENCOUNTER — Other Ambulatory Visit: Payer: Self-pay

## 2019-04-05 VITALS — BP 116/74 | HR 86 | Temp 97.6°F | Resp 16 | Ht 63.0 in | Wt 234.5 lb

## 2019-04-05 DIAGNOSIS — I83899 Varicose veins of unspecified lower extremities with other complications: Secondary | ICD-10-CM | POA: Diagnosis not present

## 2019-04-05 DIAGNOSIS — N939 Abnormal uterine and vaginal bleeding, unspecified: Secondary | ICD-10-CM

## 2019-04-05 DIAGNOSIS — I83813 Varicose veins of bilateral lower extremities with pain: Secondary | ICD-10-CM

## 2019-04-05 NOTE — Telephone Encounter (Signed)
Please review for refill.  

## 2019-04-05 NOTE — Progress Notes (Signed)
REASON FOR CONSULT:    Painful varicose veins.  The consult was requested by Julian Hy.  ASSESSMENT & PLAN:   CHRONIC VENOUS INSUFFICIENCY: This patient does have deep venous reflux bilaterally but no significant superficial venous reflux.  We have discussed the importance of intermittent leg elevation and the proper positioning for this.  I have written her a prescription for knee-high compression stockings with a gradient of 15 to 20 mmHg.  I have encouraged her to avoid prolonged sitting and standing.  We discussed the importance of exercise specifically walking and water aerobics.  In addition we discussed the importance of weight management as central obesity especially increases lower extremity venous pressure.  If her symptoms progress in the future I be happy to see her back at any time.  Deitra Mayo, MD, [place] [phone] Office: [phone]   HPI:   Lynn Molina is a pleasant 48 y.o. female, referred with painful varicose veins bilaterally.  I have reviewed the records from the referring office.  The patient was seen on 03/28/2019.  Patient does have a history of type 2 diabetes.  She was noted to have varicose veins that was painful and was referred for vascular consultation.  The patient states that she has had a long history of some aching and heaviness in both lower extremities which is aggravated by sitting and standing and relieved with elevation.  Her symptoms are more significant on the left side.  She denies any previous history of DVT or phlebitis.  She does have some swelling in both legs which is aggravated by standing and relieved with elevation.  She does not wear compression stockings.  I do not get any history of claudication or rest pain.  Past Medical History:  Diagnosis Date  . Anemia    hx  . Anxiety   . Arthritis    hips, lower  back, knees - no meds  . Asthma   . Depression   . Environmental allergies    tx with alavert and  occasional allegra  . Environmental and seasonal allergies 09/11/2017  . Eye pain, right    06/2018-resolved, no longer a problem.  Marland Kitchen GERD (gastroesophageal reflux disease)   . Head injury    Hammer fell on head - tx w lyrica  . Headache    tx with lyrica  . Morbid obesity with BMI of 40.0-44.9, adult ([place]) 09/11/2017  . Nerve damage    Head  . Speech disorder    patient uses "speech easy" in her right ear for speech disorder, 06/2018-pt states she lost "speech easy"   . Speech impediment    r/t head injury-uses "Speech easy" but did not have it at PAT appt.  Will bring it on DOS.  . 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 SVD (spontaneous vaginal delivery)    x 2    Family History  Problem Relation Age of Onset  . Breast cancer Mother   . Heart Problems Father   . Leukemia Brother   . Heart Problems Daughter   . Prostate cancer Paternal Grandfather   . Cancer Brother   . Diabetes Paternal Grandmother   . High blood pressure Paternal Grandmother   . Diabetes Maternal Grandmother   . High blood pressure Maternal Grandmother     SOCIAL HISTORY: Social History   Socioeconomic History  . Marital status: Single    Spouse name: Not on file  . Number of children: 2  .  Years of education: cosmetology  . Highest education level: Not on file  Occupational History    Employer: OTHER    Comment: n/a  Social Needs  . Financial resource strain: Not on file  . Food insecurity    Worry: Never true    Inability: Never true  . Transportation needs    Medical: No    Non-medical: No  Tobacco Use  . Smoking status: Never Smoker  . Smokeless tobacco: Never Used  Substance and Sexual Activity  . Alcohol use: Not Currently  . Drug use: No  . Sexual activity: Yes    Birth control/protection: Surgical    Comment: tubal ligation  Lifestyle  . Physical activity    Days per week: Not on file    Minutes per session: Not on file  . Stress: Very much   Relationships  . Social Herbalist on phone: Not on file    Gets together: Not on file    Attends religious service: Not on file    Active member of club or organization: Not on file    Attends meetings of clubs or organizations: Not on file    Relationship status: Not on file  . Intimate partner violence    Fear of current or ex partner: Not on file    Emotionally abused: Not on file    Physically abused: No    Forced sexual activity: Not on file  Other Topics Concern  . Not on file  Social History Narrative   Patient lives at home with daughter.   Caffeine Use: once or twice weekly    Allergies  Allergen Reactions  . Tessalon [Benzonatate] Anaphylaxis and Shortness Of Breath    Swollen face, throat swelling up  . Zyrtec [Cetirizine Hcl] Nausea And Vomiting    Current Outpatient Medications  Medication Sig Dispense Refill  . albuterol (PROVENTIL HFA;VENTOLIN HFA) 108 (90 Base) MCG/ACT inhaler Inhale 2 puffs into the lungs every 6 (six) hours as needed for wheezing or shortness of breath. 1 Inhaler 1  . famotidine (PEPCID) 20 MG tablet Take 20 mg by mouth daily as needed for heartburn or indigestion.    Marland Kitchen loratadine (CLARITIN) 10 MG tablet Take 10 mg by mouth daily.    . pregabalin (LYRICA) 150 MG capsule Take 1 capsule (150 mg total) by mouth 3 (three) times daily. 90 capsule 11  . sertraline (ZOLOFT) 50 MG tablet Take 50 mg by mouth daily.   1  . simethicone (GAS-X) 80 MG chewable tablet Chew 1 tablet (80 mg total) by mouth every 6 (six) hours as needed for flatulence. (Patient not taking: Reported on 09/06/2018) 30 tablet 0  . SYMBICORT 80-4.5 MCG/ACT inhaler 2 puffs twice daily (Patient taking differently: Inhale 2 puffs into the lungs 2 (two) times daily. 2 puffs twice daily) 1 Inhaler 11   No current facility-administered medications for this visit.     REVIEW OF SYSTEMS:  [X]  denotes positive finding, [ ]  denotes negative finding Cardiac  Comments:  Chest  pain or chest pressure:    Shortness of breath upon exertion: x   Short of breath when lying flat:    Irregular heart rhythm:        Vascular    Pain in calf, thigh, or hip brought on by ambulation: x   Pain in feet at night that wakes you up from your sleep:     Blood clot in your veins:    Leg swelling:  Pulmonary    Oxygen at home:    Productive cough:     Wheezing:         Neurologic    Sudden weakness in arms or legs:  x   Sudden numbness in arms or legs:  x   Sudden onset of difficulty speaking or slurred speech:    Temporary loss of vision in one eye:     Problems with dizziness:  x       Gastrointestinal    Blood in stool:     Vomited blood:         Genitourinary    Burning when urinating:     Blood in urine:        Psychiatric    Major depression:  x       Hematologic    Bleeding problems:    Problems with blood clotting too easily:        Skin    Rashes or ulcers:        Constitutional    Fever or chills:     PHYSICAL EXAM:   Vitals:   04/05/19 1457  BP: 116/74  Pulse: 86  Resp: 16  Temp: 97.6 F (36.4 C)  TempSrc: Temporal  SpO2: 100%  Weight: 234 lb 8 oz (106.4 kg)  Height: 5\' 3"  (1.6 m)   Body mass index is 41.54 kg/m.  GENERAL: The patient is a well-nourished female, in no acute distress. The vital signs are documented above. CARDIAC: There is a regular rate and rhythm.  VASCULAR: I do not detect carotid bruits. On the right side she has a palpable posterior tibial pulse and a brisk but monophasic dorsalis pedis signal. On the left side she has a palpable dorsalis pedis and posterior tibial pulse. Currently she has no significant lower extremity swelling or hyperpigmentation. She has some small varicose veins in the posterior calves bilaterally. PULMONARY: There is good air exchange bilaterally without wheezing or rales. ABDOMEN: Soft and non-tender with normal pitched bowel sounds.  MUSCULOSKELETAL: There are no major  deformities or cyanosis. NEUROLOGIC: No focal weakness or paresthesias are detected. SKIN: There are no ulcers or rashes noted. PSYCHIATRIC: The patient has a normal affect.  DATA:    VENOUS DUPLEX: I have independently interpreted her venous duplex scan today.  On the right side there is no evidence of DVT.  There is deep venous reflux involving the common femoral vein.  There is reflux in the saphenofemoral junction but otherwise no significant superficial venous reflux.  On the left side there is no evidence of DVT.  There is deep venous reflux involving the common femoral vein.  There is no superficial venous reflux on the left.

## 2019-07-01 IMAGING — US US PELVIS COMPLETE TRANSABD/TRANSVAG
1 series · 15 of 25 positions shown · non-contrast
Comparison: None

CLINICAL DATA: Abnormal uterine bleeding, uterine fibroids

EXAM:
TRANSABDOMINAL AND TRANSVAGINAL ULTRASOUND OF PELVIS
TECHNIQUE: Both transabdominal and transvaginal ultrasound examinations of the
pelvis were performed. Transabdominal technique was performed for
global imaging of the pelvis including uterus, ovaries, adnexal
regions, and pelvic cul-de-sac. It was necessary to proceed with
endovaginal exam following the transabdominal exam to visualize the
endometrium and bilateral adnexa.

[Series 1: us pelvis complete transabd/transvag · 49 acquisitions, 15 frames shown]
[im 1/49]
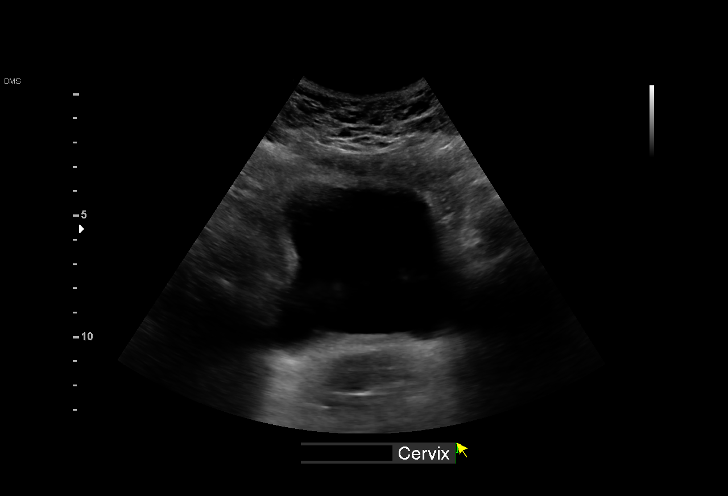
[im 5/49]
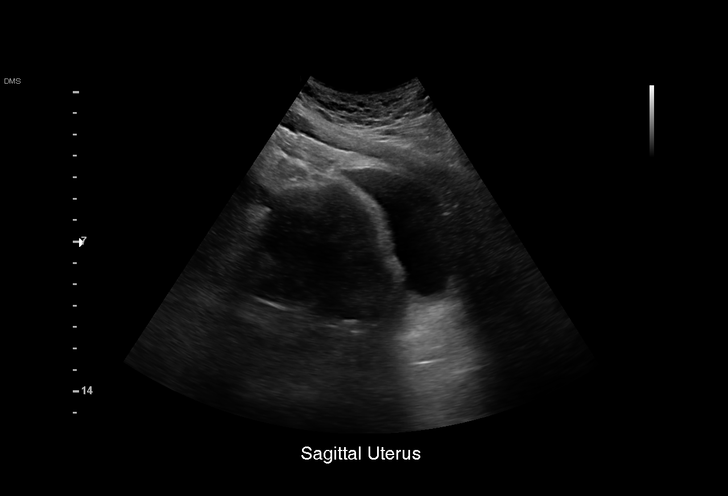
[im 9/49]
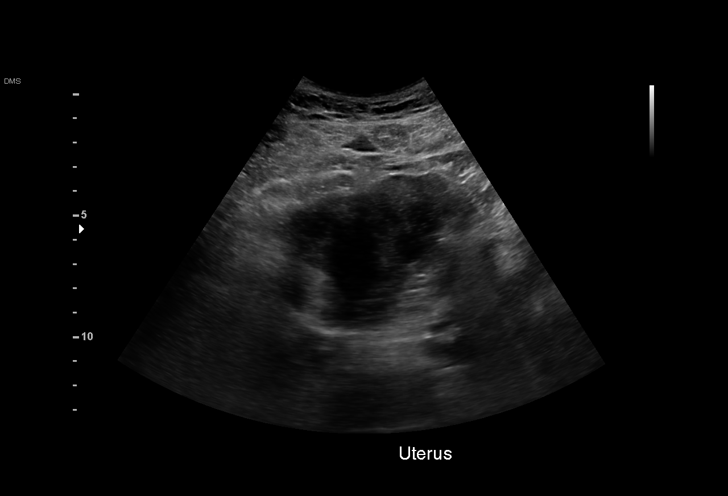
[im 11/49]
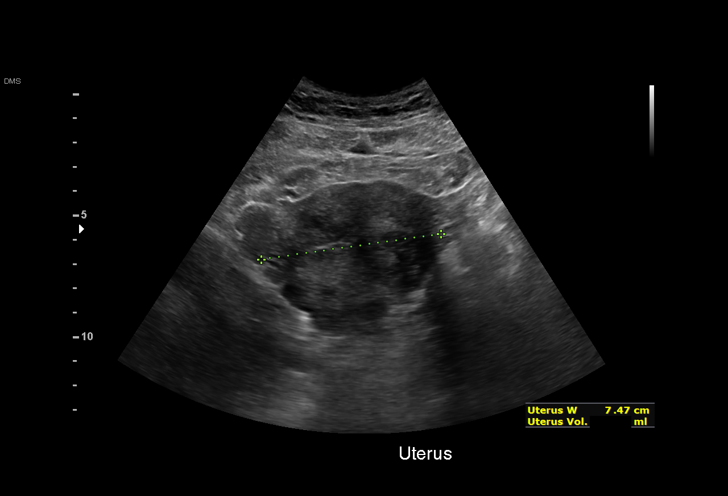
[im 15/49]
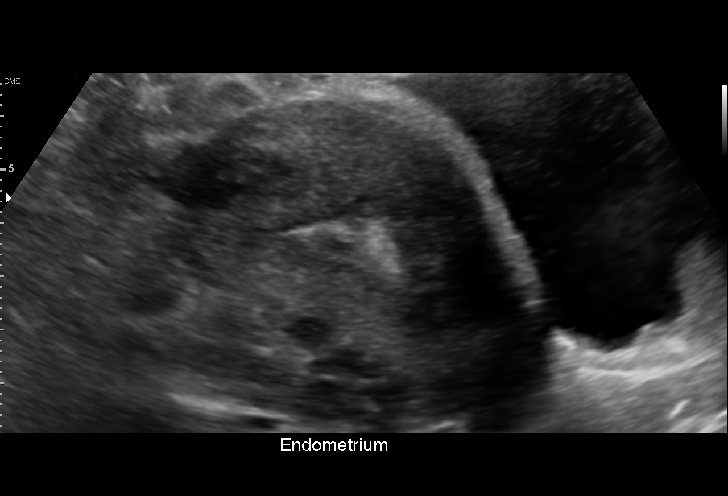
[im 19/49]
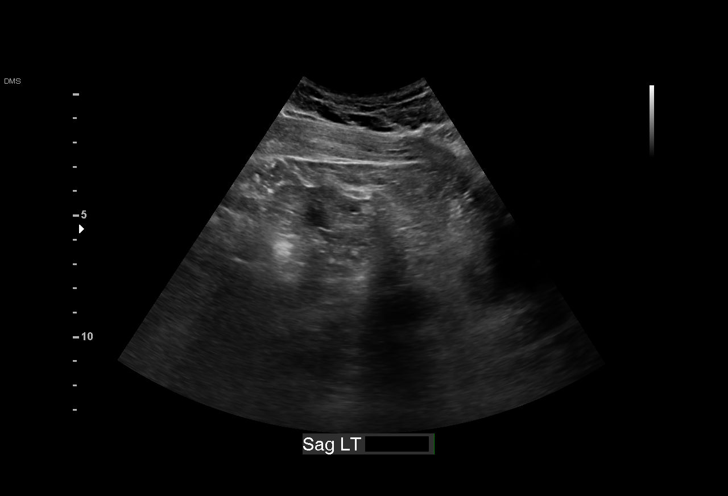
[im 21/49]
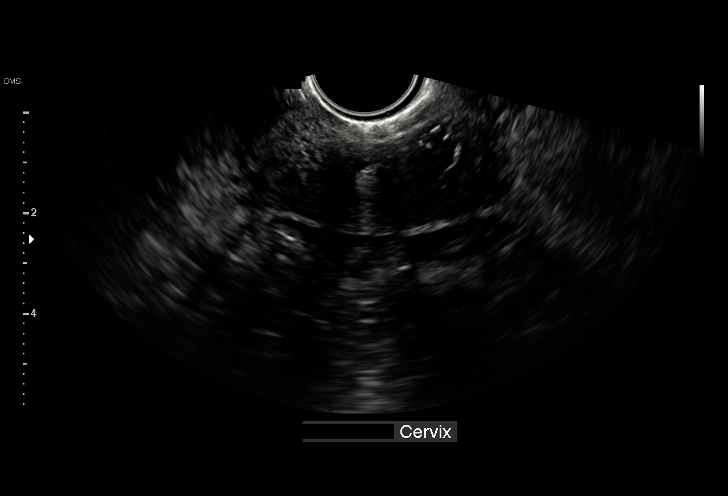
[im 25/49]
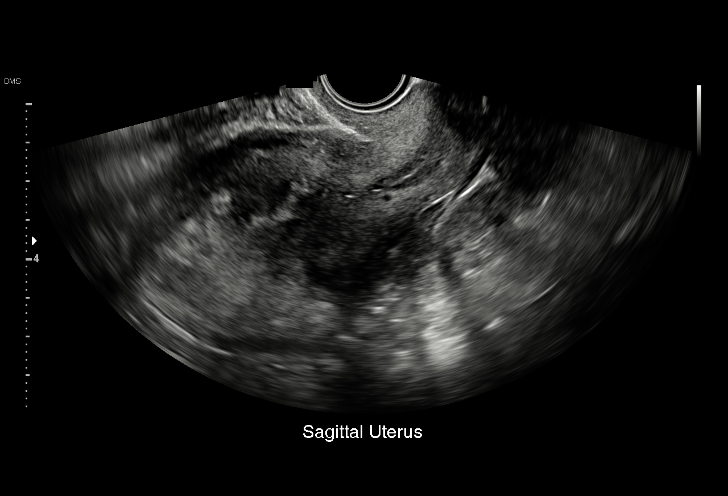
[im 29/49]
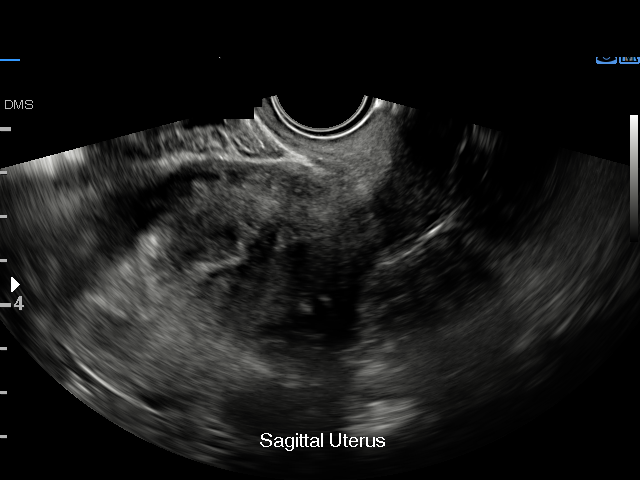
[im 31/49]
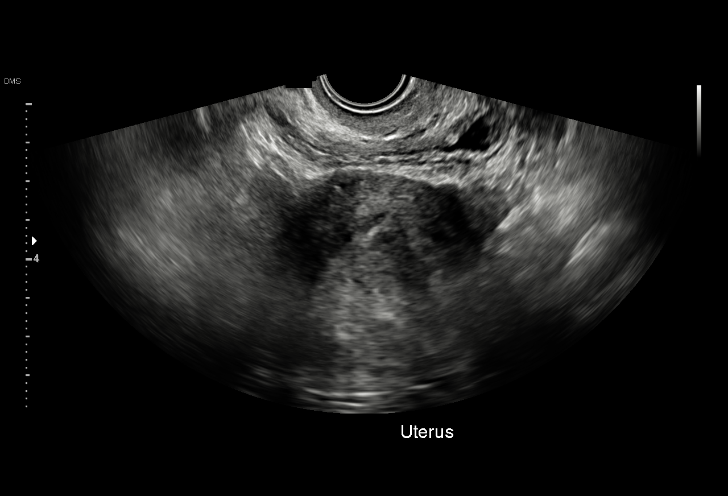
[im 35/49]
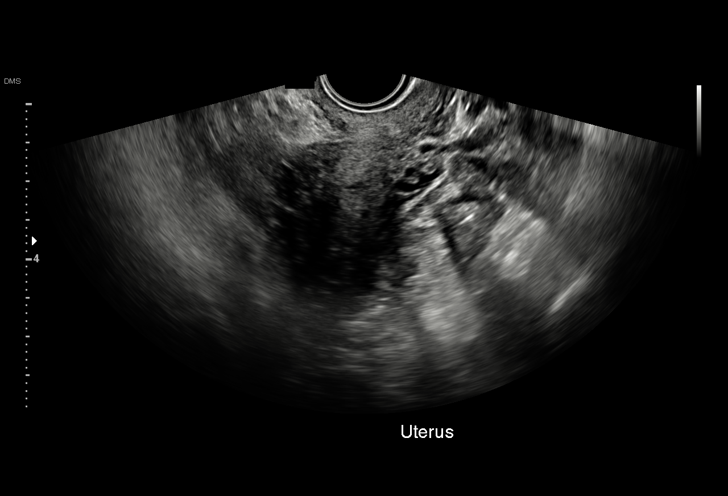
[im 39/49]
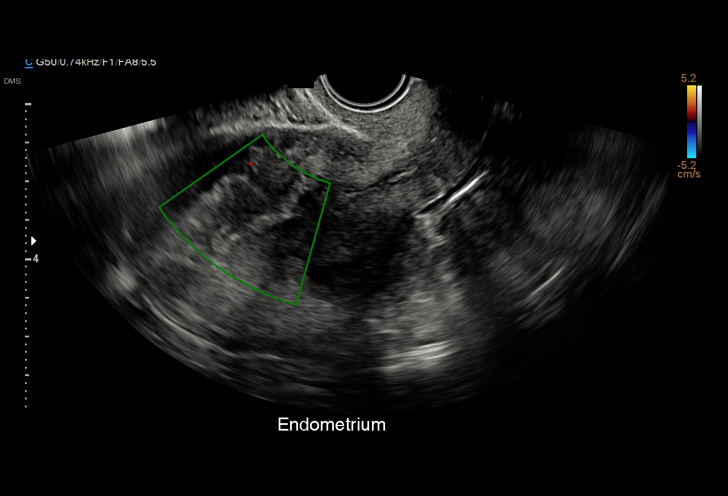
[im 41/49]
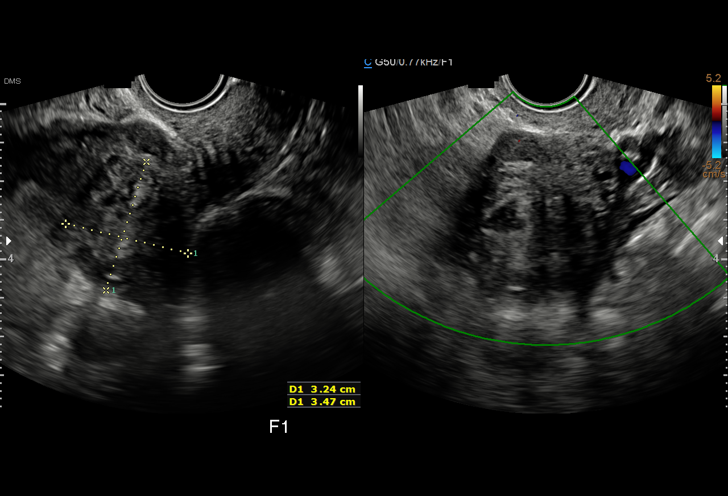
[im 45/49]
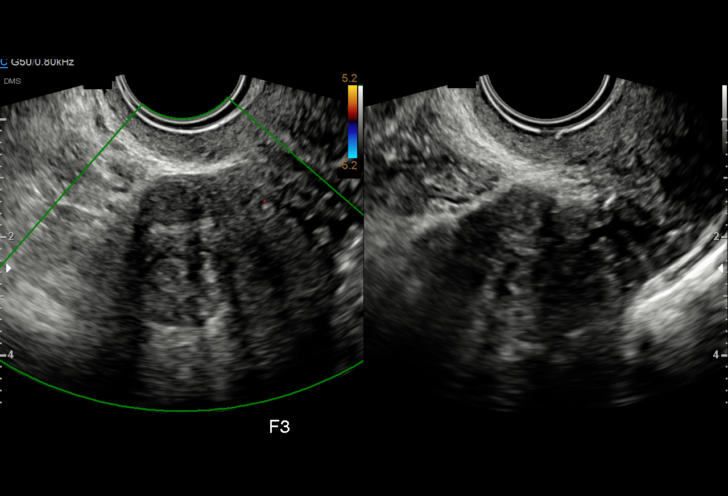
[im 49/49]
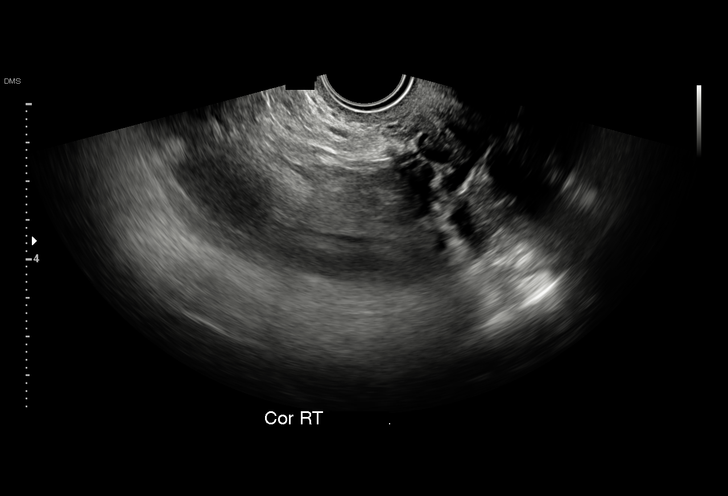

[15 of 25 positions shown; findings below may reference images not displayed]

FINDINGS: Uterus

Measurements: 9.4 x 6.4 x 7.5 cm. Multiple uterine fibroids,
including:

--3.5 x 3.2 x 3.4 cm subserosal fibroid in the right uterine fundus

--3.6 x 3.4 x 3.2 cm intramural fibroid in the left uterine fundus

--1.2 cm subserosal fibroid in the left uterine body

Endometrium

Thickness: 3 mm.  No focal abnormality visualized.

Right ovary

Not discretely visualized.  No adnexal mass is seen.

Left ovary

Not discretely visualized.  No adnexal mass is seen.

Other findings

No abnormal free fluid.
IMPRESSION: Multiple uterine fibroids, including a dominant 3.6 cm intramural
fibroid in the left uterine fundus.

Bilateral ovaries are not discretely visualized. No adnexal mass is
seen.

## 2019-08-06 ENCOUNTER — Ambulatory Visit: Payer: Medicare HMO | Attending: Internal Medicine

## 2019-08-06 DIAGNOSIS — Z20822 Contact with and (suspected) exposure to covid-19: Secondary | ICD-10-CM

## 2019-08-07 LAB — NOVEL CORONAVIRUS, NAA: SARS-CoV-2, NAA: NOT DETECTED

## 2019-08-21 ENCOUNTER — Encounter: Payer: Self-pay | Admitting: Gastroenterology

## 2019-10-05 ENCOUNTER — Other Ambulatory Visit: Payer: Self-pay | Admitting: Obstetrics and Gynecology

## 2019-10-05 DIAGNOSIS — N939 Abnormal uterine and vaginal bleeding, unspecified: Secondary | ICD-10-CM

## 2020-01-28 ENCOUNTER — Other Ambulatory Visit: Payer: Self-pay | Admitting: Obstetrics and Gynecology

## 2020-01-28 DIAGNOSIS — N939 Abnormal uterine and vaginal bleeding, unspecified: Secondary | ICD-10-CM

## 2020-01-28 NOTE — Telephone Encounter (Signed)
She does not need the Rx anymore because she has had a vag hyst. Thanks Legrand Como

## 2020-01-28 NOTE — Telephone Encounter (Signed)
Requested Prescriptions   Name from pharmacy: NORETHINDRONE 5MG  TABLETS       Will file in chart as: norethindrone (AYGESTIN) 5 MG tablet   Sig: TAKE 2 TABLETS BY MOUTH DAILY   Disp:  60 tablet    Refills:  2 (Pharmacy requested: Not specified)   Start: 01/28/2020   Class: Normal   Non-formulary For: Abnormal uterine bleeding (AUB)   Last ordered: 9 months ago by Chancy Milroy, MD Last refill: 09/02/2019   Rx #: [id number] 1 0 1     To be filled at: [place], [place]

## 2020-10-14 ENCOUNTER — Other Ambulatory Visit: Payer: Self-pay | Admitting: Adult Medicine

## 2020-10-14 DIAGNOSIS — Z1231 Encounter for screening mammogram for malignant neoplasm of breast: Secondary | ICD-10-CM

## 2020-12-12 ENCOUNTER — Encounter: Payer: Self-pay | Admitting: Obstetrics

## 2020-12-12 ENCOUNTER — Ambulatory Visit (INDEPENDENT_AMBULATORY_CARE_PROVIDER_SITE_OTHER): Payer: Medicare HMO | Admitting: Obstetrics

## 2020-12-12 ENCOUNTER — Other Ambulatory Visit: Payer: Self-pay

## 2020-12-12 ENCOUNTER — Other Ambulatory Visit (HOSPITAL_COMMUNITY)
Admission: RE | Admit: 2020-12-12 | Discharge: 2020-12-12 | Disposition: A | Discharge status: Home / Self Care | Payer: Medicare HMO | Source: Ambulatory Visit | Attending: Obstetrics | Admitting: Obstetrics

## 2020-12-12 VITALS — BP 141/85 | HR 70 | Ht 62.0 in | Wt 230.0 lb

## 2020-12-12 DIAGNOSIS — Z90710 Acquired absence of both cervix and uterus: Secondary | ICD-10-CM

## 2020-12-12 DIAGNOSIS — N898 Other specified noninflammatory disorders of vagina: Secondary | ICD-10-CM

## 2020-12-12 DIAGNOSIS — Z01419 Encounter for gynecological examination (general) (routine) without abnormal findings: Secondary | ICD-10-CM

## 2020-12-12 DIAGNOSIS — R300 Dysuria: Secondary | ICD-10-CM | POA: Diagnosis not present

## 2020-12-12 DIAGNOSIS — R479 Unspecified speech disturbances: Secondary | ICD-10-CM | POA: Diagnosis not present

## 2020-12-12 DIAGNOSIS — Z6841 Body Mass Index (BMI) 40.0 and over, adult: Secondary | ICD-10-CM

## 2020-12-12 NOTE — Progress Notes (Signed)
Patient ID: [name], female   DOB: 09-27-1970, 50 y.o.   MRN: [id number]  Chief Complaint  Patient presents with   Gynecologic Exam    HPI GLORIAN MCDONELL is a 50 y.o. female.  Complains of difficulty initiating a stream when voiding.  She has to push down on her stomach to get the urine started, and then she cannot completely empty her bladder.  She denies incontinence, or pain with voiding.This problem started ~ one and a half years ago.  She has had frequent UTI's.  PSH is significant for an uncomplicated TVH with morcellation in 2019 for symptomatic uterine fibroids and menorrhagia. HPI  Past Medical History:  Diagnosis Date   Anemia    hx   Anxiety    Arthritis    hips, lower  back, knees - no meds   Asthma    Depression    Environmental allergies    tx with alavert and occasional allegra   Environmental and seasonal allergies 09/11/2017   Eye pain, right    06/2018-resolved, no longer a problem.   GERD (gastroesophageal reflux disease)    Head injury    Hammer fell on head - tx w lyrica   Headache    tx with lyrica   Morbid obesity with BMI of 40.0-44.9, adult ([place]) 09/11/2017   Nerve damage    Head   Speech disorder    patient uses "speech easy" in her right ear for speech disorder, 06/2018-pt states she lost "speech easy"    Speech impediment    r/t head injury-uses "Speech easy" but did not have it at PAT appt.  Will bring it on DOS.   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   SVD (spontaneous vaginal delivery)    x 2    Past Surgical History:  Procedure Laterality Date   DENTAL SURGERY     tooth ext with general anesthesia   DILATATION & CURETTAGE/HYSTEROSCOPY WITH MYOSURE N/A 01/21/2016   Procedure: DILATATION & CURETTAGE/HYSTEROSCOPY WITH MYOSURE;  Surgeon: Aloha Gell, MD;  Location: [facility];  Service: Gynecology;  Laterality: N/A;   TUBAL LIGATION     VAGINAL HYSTERECTOMY  06/22/2018   Procedure: HYSTERECTOMY VAGINAL;  Surgeon:  Chancy Milroy, MD;  Location: [facility];  Service: Gynecology;;   WISDOM TOOTH EXTRACTION      Family History  Problem Relation Age of Onset   Breast cancer Mother    Heart Problems Father    Leukemia Brother    Heart Problems Daughter    Prostate cancer Paternal Grandfather    Cancer Brother    Diabetes Paternal Grandmother    High blood pressure Paternal Grandmother    Diabetes Maternal Grandmother    High blood pressure Maternal Grandmother     Social History Social History   Tobacco Use   Smoking status: Never   Smokeless tobacco: Never  Vaping Use   Vaping Use: Never used  Substance Use Topics   Alcohol use: Not Currently   Drug use: No    Allergies  Allergen Reactions   Tessalon [Benzonatate] Anaphylaxis and Shortness Of Breath    Swollen face, throat swelling up   Zyrtec [Cetirizine Hcl] Nausea And Vomiting    Current Outpatient Medications  Medication Sig Dispense Refill   albuterol (PROVENTIL HFA;VENTOLIN HFA) 108 (90 Base) MCG/ACT inhaler Inhale 2 puffs into the lungs every 6 (six) hours as needed for wheezing or shortness of breath. 1 Inhaler 1   famotidine (PEPCID)  20 MG tablet Take 20 mg by mouth daily as needed for heartburn or indigestion.     loratadine (CLARITIN) 10 MG tablet Take 10 mg by mouth daily.     norethindrone (AYGESTIN) 5 MG tablet TAKE 2 TABLETS BY MOUTH DAILY 60 tablet 2   pregabalin (LYRICA) 150 MG capsule Take 1 capsule (150 mg total) by mouth 3 (three) times daily. 90 capsule 11   sertraline (ZOLOFT) 50 MG tablet Take 50 mg by mouth daily.   1   simethicone (GAS-X) 80 MG chewable tablet Chew 1 tablet (80 mg total) by mouth every 6 (six) hours as needed for flatulence. 30 tablet 0   SYMBICORT 80-4.5 MCG/ACT inhaler 2 puffs twice daily (Patient taking differently: Inhale 2 puffs into the lungs 2 (two) times daily. 2 puffs twice daily) 1 Inhaler 11   No current facility-administered medications for this visit.    Review of  Systems Review of Systems Constitutional: negative for fatigue and weight loss Respiratory: negative for cough and wheezing Cardiovascular: negative for chest pain, fatigue and palpitations Gastrointestinal: negative for abdominal pain and change in bowel habits Genitourinary: positive for vaginal discharge and difficulty voiding Integument/breast: negative for nipple discharge Musculoskeletal:negative for myalgias Neurological: negative for gait problems and tremors Behavioral/Psych: negative for abusive relationship, depression Endocrine: negative for temperature intolerance      Blood pressure (!) 141/85, pulse 70, height 5\' 2"  (1.575 m), weight 230 lb (104.3 kg), last menstrual period 06/22/2018.  Physical Exam Physical Exam General:   Alert and no distress  Skin:   no rash or abnormalities  Lungs:   clear to auscultation bilaterally  Heart:   regular rate and rhythm, S1, S2 normal, no murmur, click, rub or gallop  Breasts:   normal without suspicious masses, skin or nipple changes or axillary nodes  Abdomen:  normal findings: no organomegaly, soft, non-tender and no hernia  Pelvis:  External genitalia: normal general appearance Urinary system: urethral meatus normal and bladder without fullness, nontender Vaginal: normal without tenderness, induration or masses.  The vaginal cuff is intact and well supported, non tender.  Bladder base is well supported and non tender, no cystocele or prolapse. Cervix: absent Adnexa: normal bimanual exam Uterus: absent    I have spent a total of 20 minutes of face-to-face and time, excluding clinical staff time, reviewing notes and preparing to see patient, ordering tests and/or medications, and counseling the patient.   Data Reviewed Wet Prep Urine Culture  Assessment      1. Women's annual routine gynecological examination Rx: - Ambulatory referral to Urogynecology  2. H/O total vaginal hysterectomy Rx:  3. Vaginal discharge Rx: -  Cervicovaginal ancillary only  4. Dysuria Rx: - Ambulatory referral to Urogynecology - Urine Culture  5. Class 3 severe obesity without serious comorbidity with body mass index (BMI) of 40.0 to 44.9 in adult, unspecified obesity type ([place]) - weight loss with the aid of caloric reduction, exercise and behavioral modification recommended  6. Speech impediment    Plan   Referred to UroGyn   Follow up prn   Shelly Bombard, MD 12/12/2020 10:38 AM

## 2020-12-12 NOTE — Progress Notes (Signed)
GYN presents for AEX. She had Hysterectomy 2020.  C/o having to treated 3 times for a UTI. Her GP told her to get checked for prolapse. She have to press her stomach in order to urinate.  Next [facility]  01/30/2021

## 2020-12-16 LAB — URINE CULTURE

## 2020-12-23 ENCOUNTER — Other Ambulatory Visit: Payer: Self-pay | Admitting: Obstetrics

## 2020-12-23 LAB — CERVICOVAGINAL ANCILLARY ONLY
Bacterial Vaginitis (gardnerella): POSITIVE — AB
Candida Glabrata: POSITIVE — AB
Candida Vaginitis: NEGATIVE
Chlamydia: NEGATIVE
Comment: NEGATIVE
Comment: NEGATIVE
Comment: NEGATIVE
Comment: NEGATIVE
Comment: NEGATIVE
Comment: NORMAL
Neisseria Gonorrhea: NEGATIVE
Trichomonas: NEGATIVE

## 2020-12-24 ENCOUNTER — Other Ambulatory Visit: Payer: Self-pay

## 2020-12-24 MED ORDER — BORIC ACID CRYS: 600.0000 mg | CRYSTALS | Freq: Every day | 0 refills | Status: DC

## 2020-12-24 MED ORDER — METRONIDAZOLE 500 MG PO TABS: 500.0000 mg | ORAL_TABLET | Freq: Two times a day (BID) | ORAL | 0 refills | Status: DC

## 2020-12-24 NOTE — Addendum Note (Signed)
Addended by: Maryruth Eve on: 12/24/2020 04:29 PM   Modules accepted: Orders

## 2020-12-25 ENCOUNTER — Telehealth: Payer: Self-pay

## 2020-12-25 NOTE — Telephone Encounter (Signed)
Attempt made to contact Lynn Molina is a 49 y.o. female re: New Patient appointment with Dr. Wannetta Sender. Pt was not available.  LM on the VM for the patient to call me back.

## 2021-01-30 ENCOUNTER — Ambulatory Visit
Admission: RE | Admit: 2021-01-30 | Discharge: 2021-01-30 | Disposition: A | Discharge status: Home / Self Care | Payer: Medicare HMO | Source: Ambulatory Visit | Attending: Adult Medicine | Admitting: Adult Medicine

## 2021-01-30 ENCOUNTER — Other Ambulatory Visit: Payer: Self-pay

## 2021-01-30 DIAGNOSIS — Z1231 Encounter for screening mammogram for malignant neoplasm of breast: Secondary | ICD-10-CM

## 2021-03-30 ENCOUNTER — Emergency Department (HOSPITAL_COMMUNITY): Payer: Medicare HMO

## 2021-03-30 ENCOUNTER — Other Ambulatory Visit: Payer: Self-pay

## 2021-03-30 ENCOUNTER — Emergency Department (HOSPITAL_COMMUNITY)
Admission: EM | Admit: 2021-03-30 | Discharge: 2021-03-31 | Disposition: A | Discharge status: Home / Self Care | Payer: Medicare HMO | Attending: Emergency Medicine | Admitting: Emergency Medicine

## 2021-03-30 DIAGNOSIS — Z7951 Long term (current) use of inhaled steroids: Secondary | ICD-10-CM | POA: Diagnosis not present

## 2021-03-30 DIAGNOSIS — B373 Candidiasis of vulva and vagina: Secondary | ICD-10-CM | POA: Insufficient documentation

## 2021-03-30 DIAGNOSIS — J45909 Unspecified asthma, uncomplicated: Secondary | ICD-10-CM | POA: Insufficient documentation

## 2021-03-30 DIAGNOSIS — M545 Low back pain, unspecified: Secondary | ICD-10-CM | POA: Insufficient documentation

## 2021-03-30 DIAGNOSIS — R102 Pelvic and perineal pain: Secondary | ICD-10-CM | POA: Insufficient documentation

## 2021-03-30 DIAGNOSIS — B3731 Acute candidiasis of vulva and vagina: Secondary | ICD-10-CM

## 2021-03-30 DIAGNOSIS — R109 Unspecified abdominal pain: Secondary | ICD-10-CM | POA: Diagnosis present

## 2021-03-30 LAB — CBC WITH DIFFERENTIAL/PLATELET
Abs Immature Granulocytes: 0.03 10*3/uL (ref 0.00–0.07)
Basophils Absolute: 0 10*3/uL (ref 0.0–0.1)
Basophils Relative: 1 %
Eosinophils Absolute: 0.3 10*3/uL (ref 0.0–0.5)
Eosinophils Relative: 4 %
HCT: 39.3 % (ref 36.0–46.0)
Hemoglobin: 11.7 g/dL — ABNORMAL LOW (ref 12.0–15.0)
Immature Granulocytes: 0 %
Lymphocytes Relative: 30 %
Lymphs Abs: 2.1 10*3/uL (ref 0.7–4.0)
MCH: 23.5 pg — ABNORMAL LOW (ref 26.0–34.0)
MCHC: 29.8 g/dL — ABNORMAL LOW (ref 30.0–36.0)
MCV: 79.1 fL — ABNORMAL LOW (ref 80.0–100.0)
Monocytes Absolute: 0.4 10*3/uL (ref 0.1–1.0)
Monocytes Relative: 5 %
Neutro Abs: 4.2 10*3/uL (ref 1.7–7.7)
Neutrophils Relative %: 60 %
Platelets: 367 10*3/uL (ref 150–400)
RBC: 4.97 MIL/uL (ref 3.87–5.11)
RDW: 14.9 % (ref 11.5–15.5)
WBC: 7 10*3/uL (ref 4.0–10.5)
nRBC: 0 % (ref 0.0–0.2)

## 2021-03-30 LAB — URINALYSIS, ROUTINE W REFLEX MICROSCOPIC
Bilirubin Urine: NEGATIVE
Glucose, UA: NEGATIVE mg/dL
Hgb urine dipstick: NEGATIVE
Ketones, ur: NEGATIVE mg/dL
Leukocytes,Ua: NEGATIVE
Nitrite: NEGATIVE
Protein, ur: NEGATIVE mg/dL
Specific Gravity, Urine: 1.021 (ref 1.005–1.030)
pH: 5 (ref 5.0–8.0)

## 2021-03-30 LAB — COMPREHENSIVE METABOLIC PANEL
ALT: 20 U/L (ref 0–44)
AST: 19 U/L (ref 15–41)
Albumin: 4.1 g/dL (ref 3.5–5.0)
Alkaline Phosphatase: 65 U/L (ref 38–126)
Anion gap: 11 (ref 5–15)
BUN: 8 mg/dL (ref 6–20)
CO2: 28 mmol/L (ref 22–32)
Calcium: 9.8 mg/dL (ref 8.9–10.3)
Chloride: 99 mmol/L (ref 98–111)
Creatinine, Ser: 0.83 mg/dL (ref 0.44–1.00)
GFR, Estimated: >60 mL/min (ref >60)
Glucose, Bld: 137 mg/dL — ABNORMAL HIGH (ref 70–99)
Potassium: 4.2 mmol/L (ref 3.5–5.1)
Sodium: 138 mmol/L (ref 135–145)
Total Bilirubin: 0.3 mg/dL (ref 0.3–1.2)
Total Protein: 7.4 g/dL (ref 6.5–8.1)

## 2021-03-30 LAB — I-STAT BETA HCG BLOOD, ED (MC, WL, AP ONLY): I-stat hCG, quantitative: <5 m[IU]/mL (ref <5)

## 2021-03-30 LAB — LIPASE, BLOOD: Lipase: 29 U/L (ref 11–51)

## 2021-03-30 MED ORDER — OXYCODONE-ACETAMINOPHEN 5-325 MG PO TABS
1.0000 | ORAL_TABLET | Freq: Once | ORAL | Status: AC
  Administered 2021-03-30: 1 via ORAL
  Filled 2021-03-30: qty 1

## 2021-03-30 NOTE — ED Triage Notes (Signed)
Pt with recent hx of kidney infection for which she was on abx here for eval of flank pain and hematuria. Reports symptoms improved only mildly and then recurred.

## 2021-03-30 NOTE — ED Provider Notes (Signed)
Emergency Medicine Provider Triage Evaluation Note  Lynn Molina , a 50 y.o. female  was evaluated in triage.  Pt complains of right flank pain and urinary sxs x3 weeks. Recently tx for uti, sxs improved temporarily but are now present again.  Review of Systems  Positive: Right flank pain, hematuria, dysuria Negative: Fever, nv  Physical Exam  LMP 06/22/2018  Gen:   Awake, no distress   Resp:  Normal effort  MSK:   Moves extremities without difficulty  Other:  Right cva ttp  Medical Decision Making  Medically screening exam initiated at 5:02 PM.  Appropriate orders placed.  [facility] was informed that the remainder of the evaluation will be completed by another provider, this initial triage assessment does not replace that evaluation, and the importance of remaining in the ED until their evaluation is complete.     Rodney Booze, PA-C 03/30/21 1703    Lucrezia Starch, MD 03/31/21 1414

## 2021-03-31 ENCOUNTER — Emergency Department (HOSPITAL_COMMUNITY): Payer: Medicare HMO

## 2021-03-31 DIAGNOSIS — B373 Candidiasis of vulva and vagina: Secondary | ICD-10-CM | POA: Diagnosis not present

## 2021-03-31 LAB — GC/CHLAMYDIA PROBE AMP (~~LOC~~) NOT AT ARMC
Chlamydia: NEGATIVE
Comment: NEGATIVE
Comment: NORMAL
Neisseria Gonorrhea: NEGATIVE

## 2021-03-31 LAB — WET PREP, GENITAL
Sperm: NONE SEEN
Trich, Wet Prep: NONE SEEN
WBC, Wet Prep HPF POC: NONE SEEN

## 2021-03-31 MED ORDER — FLUCONAZOLE 150 MG PO TABS
150.0000 mg | ORAL_TABLET | Freq: Once | ORAL | Status: AC
  Administered 2021-03-31: 150 mg via ORAL
  Filled 2021-03-31: qty 1

## 2021-03-31 MED ORDER — KETOROLAC TROMETHAMINE 60 MG/2ML IM SOLN
60.0000 mg | Freq: Once | INTRAMUSCULAR | Status: AC
  Administered 2021-03-31: 60 mg via INTRAMUSCULAR
  Filled 2021-03-31: qty 2

## 2021-03-31 NOTE — Discharge Instructions (Addendum)
Thank you for allowing me to care for you today in the Emergency Department.   You can take 650 mg of Tylenol once every 6 hours for pain. You can apply warm compresses for 15 to 20 minutes up to 3-4 times a day for your symptoms.  Call to schedule a follow-up appointment with OB/GYN.  You did have a yeast infection today and this was treated with Diflucan.  Return to the emergency department if you develop uncontrollable vomiting, start having a fever with worsening pain, severe, uncontrollable bleeding, or other new, concerning symptoms.

## 2021-03-31 NOTE — ED Provider Notes (Signed)
[facility] EMERGENCY DEPARTMENT Provider Note   CSN: [id number] Arrival date & time: 03/30/21  1655     History Chief Complaint  Patient presents with   Flank Pain    AKHILA MAHNKEN is a 50 y.o. 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   The patient reports that she has been having worsening right flank pain and right-sided low back pain for the last 2 to 3 weeks and dysuria.  She was started on a course of nitrofurantoin for concern for UTI, but symptoms did not resolve.  Earlier today, she noticed a small amount of bright red blood on the toilet tissue when she wiped there was concern that she was having hematuria.  She characterizes the pain as sharp and stabbing.  Pain radiates from her right lower abdomen into her right lower back.  No known aggravating relieving factors.  No fever, chills, vomiting, diarrhea, constipation, chest pain, shortness of breath, cough, vaginal discharge, urinary frequency, malodorous urine.  She has no history of kidney stones.  She reports that she has not been sexually active in over 15 years.  She has no concerns for STIs.  Surgical history includes hysterectomy.  The history is provided by the patient and medical records. No language interpreter was used.      Past Medical History:  Diagnosis Date   Anemia    hx   Anxiety    Arthritis    hips, lower  back, knees - no meds   Asthma    Depression    Environmental allergies    tx with alavert and occasional allegra   Environmental and seasonal allergies 09/11/2017   Eye pain, right    06/2018-resolved, no longer a problem.   GERD (gastroesophageal reflux disease)    Head injury    Hammer fell on head - tx w lyrica   Headache    tx with lyrica   Morbid obesity with BMI of 40.0-44.9, adult ([place]) 09/11/2017   Nerve damage    Head   Speech disorder    patient uses "speech easy"  in her right ear for speech disorder, 06/2018-pt states she lost "speech easy"    Speech impediment    r/t head injury-uses "Speech easy" but did not have it at PAT appt.  Will bring it on DOS.   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   SVD (spontaneous vaginal delivery)    x 2    Patient Active Problem List   Diagnosis Date Noted   Post-operative state 06/22/2018   Stuttering 09/11/2017   Asthma 09/11/2017   Environmental and seasonal allergies 09/11/2017   Morbid obesity with BMI of 40.0-44.9, adult ([place]) 09/11/2017   Confusion 11/04/2016   History of traumatic brain injury 11/04/2016   Headaches due to old head injury 05/15/2013    Past Surgical History:  Procedure Laterality Date   DENTAL SURGERY     tooth ext with general anesthesia   DILATATION & CURETTAGE/HYSTEROSCOPY WITH MYOSURE N/A 01/21/2016   Procedure: DILATATION & CURETTAGE/HYSTEROSCOPY WITH MYOSURE;  Surgeon: Aloha Gell, MD;  Location: [facility];  Service: Gynecology;  Laterality: N/A;   TUBAL LIGATION     VAGINAL HYSTERECTOMY  06/22/2018   Procedure: HYSTERECTOMY VAGINAL;  Surgeon: Chancy Milroy, MD;  Location: [facility];  Service: Gynecology;;   WISDOM TOOTH EXTRACTION  OB History     Gravida  2   Para  2   Term  2   Preterm  0   AB  0   Living  2      SAB  0   IAB  0   Ectopic  0   Multiple  0   Live Births  2           Family History  Problem Relation Age of Onset   Breast cancer Mother    Heart Problems Father    Leukemia Brother    Heart Problems Daughter    Prostate cancer Paternal Grandfather    Cancer Brother    Diabetes Paternal Grandmother    High blood pressure Paternal Grandmother    Diabetes Maternal Grandmother    High blood pressure Maternal Grandmother     Social History   Tobacco Use   Smoking status: Never   Smokeless tobacco: Never  Vaping Use   Vaping Use: Never used  Substance Use Topics   Alcohol use: Not Currently    Drug use: No    Home Medications Prior to Admission medications   Medication Sig Start Date End Date Taking? Authorizing Provider  albuterol (PROVENTIL HFA;VENTOLIN HFA) 108 (90 Base) MCG/ACT inhaler Inhale 2 puffs into the lungs every 6 (six) hours as needed for wheezing or shortness of breath. 09/07/17   Mack Hook, MD  Boric Acid [facility] 600 mg vaginally at bedtime. Use vaginally every night for two weeks 12/24/20   Shelly Bombard, MD  famotidine (PEPCID) 20 MG tablet Take 20 mg by mouth daily as needed for heartburn or indigestion.    [provider]  loratadine (CLARITIN) 10 MG tablet Take 10 mg by mouth daily.    [provider]  metroNIDAZOLE (FLAGYL) 500 MG tablet Take 1 tablet (500 mg total) by mouth 2 (two) times daily. 12/24/20   Shelly Bombard, MD  norethindrone (AYGESTIN) 5 MG tablet TAKE 2 TABLETS BY MOUTH DAILY 04/06/19   Chancy Milroy, MD  pregabalin (LYRICA) 150 MG capsule Take 1 capsule (150 mg total) by mouth 3 (three) times daily. 09/07/17   Mack Hook, MD  sertraline (ZOLOFT) 50 MG tablet Take 50 mg by mouth daily.  04/28/18   [provider]  simethicone (GAS-X) 80 MG chewable tablet Chew 1 tablet (80 mg total) by mouth every 6 (six) hours as needed for flatulence. 06/30/18   Leftwich-Kirby, Kathie Dike, CNM  SYMBICORT 80-4.5 MCG/ACT inhaler 2 puffs twice daily Patient taking differently: Inhale 2 puffs into the lungs 2 (two) times daily. 2 puffs twice daily 09/07/17   Mack Hook, MD    Allergies    Tessalon [benzonatate], Ibuprofen, and Zyrtec [cetirizine hcl]  Review of Systems   Review of Systems  Constitutional:  Negative for activity change, chills, fatigue and fever.  HENT:  Negative for congestion and sore throat.   Eyes:  Negative for visual disturbance.  Respiratory:  Negative for shortness of breath.   Cardiovascular:  Negative for chest pain.  Gastrointestinal:  Negative for abdominal pain, diarrhea,  nausea and vomiting.  Genitourinary:  Positive for dysuria, flank pain, hematuria and pelvic pain. Negative for vaginal discharge.  Musculoskeletal:  Positive for back pain. Negative for arthralgias, gait problem, joint swelling, neck pain and neck stiffness.  Skin:  Negative for rash.  Allergic/Immunologic: Negative for immunocompromised state.  Neurological:  Negative for dizziness, seizures, syncope, weakness, numbness and headaches.  Psychiatric/Behavioral:  Negative for  confusion.    Physical Exam Updated Vital Signs BP (!) 129/54   Pulse 92   Temp 98.3 F (36.8 C)   Resp (!) 29   LMP 06/22/2018   SpO2 97%   Physical Exam Vitals and nursing note reviewed.  Constitutional:      General: She is not in acute distress.    Appearance: She is not ill-appearing, toxic-appearing or diaphoretic.  HENT:     Head: Normocephalic.  Eyes:     Conjunctiva/sclera: Conjunctivae normal.  Cardiovascular:     Rate and Rhythm: Normal rate and regular rhythm.     Pulses: Normal pulses.     Heart sounds: Normal heart sounds. No murmur heard.   No friction rub. No gallop.  Pulmonary:     Effort: Pulmonary effort is normal. No respiratory distress.     Breath sounds: No stridor. No wheezing, rhonchi or rales.  Chest:     Chest wall: No tenderness.  Abdominal:     General: There is no distension.     Palpations: Abdomen is soft. There is no mass.     Tenderness: There is abdominal tenderness. There is no right CVA tenderness, left CVA tenderness, guarding or rebound.     Hernia: No hernia is present.     Comments: Tender to palpation in the right pelvic region.  No tenderness over McBurney's point.  No rebound or guarding.  Normoactive bowel sounds.  No CVA tenderness bilaterally.  Negative Murphy sign.  Genitourinary:    Comments: Chaperoned exam with nurse tech and RN.  Scant thick white discharge in the vaginal vault.  No left adnexal tenderness or masses.  There is adnexal tenderness on  the right, but no palpable masses.  Vaginal cuff is intact. Musculoskeletal:     Cervical back: Neck supple.     Comments: Mild tender to palpation in the right lumbar musculature.  No midline tenderness to the cervical, thoracic, or lumbar spinous processes.  Skin:    General: Skin is warm.     Findings: No rash.  Neurological:     General: No focal deficit present.     Mental Status: She is alert.  Psychiatric:        Behavior: Behavior normal.    ED Results / Procedures / Treatments   Labs (all labs ordered are listed, but only abnormal results are displayed) Labs Reviewed  WET PREP, GENITAL - Abnormal; Notable for the following components:      Result Value   Yeast Wet Prep HPF POC PRESENT (*)    Clue Cells Wet Prep HPF POC PRESENT (*)    All other components within normal limits  CBC WITH DIFFERENTIAL/PLATELET - Abnormal; Notable for the following components:   Hemoglobin 11.7 (*)    MCV 79.1 (*)    MCH 23.5 (*)    MCHC 29.8 (*)    All other components within normal limits  COMPREHENSIVE METABOLIC PANEL - Abnormal; Notable for the following components:   Glucose, Bld 137 (*)    All other components within normal limits  LIPASE, BLOOD  URINALYSIS, ROUTINE W REFLEX MICROSCOPIC  I-STAT BETA HCG BLOOD, ED ([facility], [facility], [facility] ONLY)  GC/CHLAMYDIA PROBE AMP ([place]) NOT AT [facility]    EKG None  Radiology CT Renal Stone Study  Result Date: 03/30/2021 CLINICAL DATA:  Flank pain hematuria EXAM: CT ABDOMEN AND PELVIS WITHOUT CONTRAST TECHNIQUE: Multidetector CT imaging of the abdomen and pelvis was performed following the standard protocol without IV contrast. COMPARISON:  CT 06/30/2018 FINDINGS: Lower chest: No acute abnormality. Hepatobiliary: Hepatic steatosis. Contracted gallbladder without calcified stone. No focal hepatic abnormality. Pancreas: Unremarkable. No pancreatic ductal dilatation or surrounding inflammatory changes. Spleen: Normal in size without focal abnormality.  Adrenals/Urinary Tract: Adrenal glands are unremarkable. Kidneys are normal, without renal calculi, focal lesion, or hydronephrosis. Bladder is unremarkable. Stomach/Bowel: Stomach is within normal limits. Appendix appears normal. No evidence of bowel wall thickening, distention, or inflammatory changes. Vascular/Lymphatic: No significant vascular findings are present. No enlarged abdominal or pelvic lymph nodes. Reproductive: Status post hysterectomy. No adnexal masses. Other: No abdominal wall hernia or abnormality. No abdominopelvic ascites. Fat containing umbilical hernia, small. Musculoskeletal: No acute or significant osseous findings. IMPRESSION: 1. No CT evidence for acute intra-abdominal or pelvic abnormality. 2. Hepatic steatosis Electronically Signed   By: Donavan Foil M.D.   On: 03/30/2021 19:24   US PELVIC COMPLETE W TRANSVAGINAL AND TORSION R/O  Result Date: 03/31/2021 CLINICAL DATA:  Right adnexal tenderness EXAM: TRANSABDOMINAL AND TRANSVAGINAL ULTRASOUND OF PELVIS DOPPLER ULTRASOUND OF OVARIES TECHNIQUE: Both transabdominal and transvaginal ultrasound examinations of the pelvis were performed. Transabdominal technique was performed for global imaging of the pelvis including uterus, ovaries, adnexal regions, and pelvic cul-de-sac. It was necessary to proceed with endovaginal exam following the transabdominal exam to visualize the right ovary. Color and duplex Doppler ultrasound was utilized to evaluate blood flow to the ovaries. COMPARISON:  Abdominal CT from earlier today. Pelvic ultrasound 04/12/2018. FINDINGS: Uterus Surgically absent Right ovary Measurements: 13 x 26 x 17 mm = volume: 2.8 mL. Normal appearance/no adnexal mass. Left ovary Measurements: 23 x 25 x 21 mm = volume: 6.4 mL. Normal appearance/no adnexal mass. Low resistance arterial waveforms are demonstrated bilaterally. Venous waveforms are not clearly captured. No morphologic changes of torsion. Other findings No abnormal free  fluid. IMPRESSION: 1. Normal ultrasound of the ovaries. 2. Hysterectomy. Electronically Signed   By: Jorje Guild M.D.   On: 03/31/2021 07:04    Procedures Procedures   Medications Ordered in ED Medications  oxyCODONE-acetaminophen (PERCOCET/ROXICET) 5-325 MG per tablet 1 tablet (1 tablet Oral Given 03/30/21 1923)  ketorolac (TORADOL) injection 60 mg (60 mg Intramuscular Given 03/31/21 0421)  fluconazole (DIFLUCAN) tablet 150 mg (150 mg Oral Given 03/31/21 2376)    ED Course  I have reviewed the triage vital signs and the nursing notes.  Pertinent labs & imaging results that were available during my care of the patient were reviewed by me and considered in my medical decision making (see chart for details).    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  It she also is concerned for hematuria after noticing a small amount of bright red blood on the toilet tissue when she wiped earlier today.  Plan signs are stable.  On exam, she has no peritoneal signs.  She is actually tender to palpation in the right pelvic region.  She has no CVA tenderness bilaterally.  She has negative Murphy sign.  Lungs are clear to auscultation bilaterally and she denies chest pain or shortness of breath.  Labs and imaging been reviewed and independently interpreted by me.  Urinalysis is not concerning for infection.  No hemoglobinuria or RBCs.  She is anemic, but this is chronic and is significantly improved from previous.  No metabolic derangements.  CT stone study is unremarkable.  No evidence of ureterolithiasis or other acute findings.  I did also consider pneumothorax versus PE, but less likely given the patient's presentation and chronicity of his symptoms.  She also has stable vital signs.  On exam, given pelvic  tenderness, there is concern for right ovarian pathology.    Patient care transferred to [facility] at the end of my shift to follow-up on pelvic ultrasound. Patient presentation, ED course, and plan of care discussed with review of all pertinent labs and imaging. Please see his/her note for further details regarding further ED course and disposition.   Final Clinical Impression(s) / ED Diagnoses Final diagnoses:  Pelvic pain  Vaginal candidiasis    Rx / DC Orders ED Discharge Orders     None        Joanne Gavel, PA-C 03/31/21 0845    Merryl Hacker, MD 04/03/21 2319

## 2021-03-31 NOTE — ED Provider Notes (Signed)
R lower back/pelvic pain.  Normal stone study, but does have tenderness to R pelvic region.  F/u pelvic US.  Fortunately pelvic ultrasound without any acute abnormalities.  Care discussed with patient, will discharge home with symptomatic treatments and outpatient follow-up.  Patient voiced understanding and agrees with plan.  Patient did have evidence of yeast infection likely from prolonged antibiotic use.  Patient was given Diflucan in ER  BP (!) 129/54   Pulse 92   Temp 98.3 F (36.8 C)   Resp (!) 29   LMP 06/22/2018   SpO2 97%   Results for orders placed or performed during the hospital encounter of 03/30/21  Wet prep, genital   Specimen: PATH Cytology Cervicovaginal Ancillary Only  Result Value Ref Range   Yeast Wet Prep HPF POC PRESENT (A) NONE SEEN   Trich, Wet Prep NONE SEEN NONE SEEN   Clue Cells Wet Prep HPF POC PRESENT (A) NONE SEEN   WBC, Wet Prep HPF POC NONE SEEN NONE SEEN   Sperm NONE SEEN   CBC with Differential  Result Value Ref Range   WBC 7.0 4.0 - 10.5 K/uL   RBC 4.97 3.87 - 5.11 MIL/uL   Hemoglobin 11.7 (L) 12.0 - 15.0 g/dL   HCT 39.3 36.0 - 46.0 %   MCV 79.1 (L) 80.0 - 100.0 fL   MCH 23.5 (L) 26.0 - 34.0 pg   MCHC 29.8 (L) 30.0 - 36.0 g/dL   RDW 14.9 11.5 - 15.5 %   Platelets 367 150 - 400 K/uL   nRBC 0.0 0.0 - 0.2 %   Neutrophils Relative % 60 %   Neutro Abs 4.2 1.7 - 7.7 K/uL   Lymphocytes Relative 30 %   Lymphs Abs 2.1 0.7 - 4.0 K/uL   Monocytes Relative 5 %   Monocytes Absolute 0.4 0.1 - 1.0 K/uL   Eosinophils Relative 4 %   Eosinophils Absolute 0.3 0.0 - 0.5 K/uL   Basophils Relative 1 %   Basophils Absolute 0.0 0.0 - 0.1 K/uL   Immature Granulocytes 0 %   Abs Immature Granulocytes 0.03 0.00 - 0.07 K/uL  Comprehensive metabolic panel  Result Value Ref Range   Sodium 138 135 - 145 mmol/L   Potassium 4.2 3.5 - 5.1 mmol/L   Chloride 99 98 - 111 mmol/L   CO2 28 22 - 32 mmol/L   Glucose, Bld 137 (H) 70 - 99 mg/dL   BUN 8 6 - 20 mg/dL    Creatinine, Ser 0.83 0.44 - 1.00 mg/dL   Calcium 9.8 8.9 - 10.3 mg/dL   Total Protein 7.4 6.5 - 8.1 g/dL   Albumin 4.1 3.5 - 5.0 g/dL   AST 19 15 - 41 U/L   ALT 20 0 - 44 U/L   Alkaline Phosphatase 65 38 - 126 U/L   Total Bilirubin 0.3 0.3 - 1.2 mg/dL   GFR, Estimated >60 >60 mL/min   Anion gap 11 5 - 15  Lipase, blood  Result Value Ref Range   Lipase 29 11 - 51 U/L  Urinalysis, Routine w reflex microscopic  Result Value Ref Range   Color, Urine YELLOW YELLOW   APPearance CLEAR CLEAR   Specific Gravity, Urine 1.021 1.005 - 1.030   pH 5.0 5.0 - 8.0   Glucose, UA NEGATIVE NEGATIVE mg/dL   Hgb urine dipstick NEGATIVE NEGATIVE   Bilirubin Urine NEGATIVE NEGATIVE   Ketones, ur NEGATIVE NEGATIVE mg/dL   Protein, ur NEGATIVE NEGATIVE mg/dL   Nitrite NEGATIVE NEGATIVE  Leukocytes,Ua NEGATIVE NEGATIVE  I-Stat beta hCG blood, ED  Result Value Ref Range   I-stat hCG, quantitative <5.0 <5 mIU/mL   Comment 3           CT Renal Stone Study  Result Date: 03/30/2021 CLINICAL DATA:  Flank pain hematuria EXAM: CT ABDOMEN AND PELVIS WITHOUT CONTRAST TECHNIQUE: Multidetector CT imaging of the abdomen and pelvis was performed following the standard protocol without IV contrast. COMPARISON:  CT 06/30/2018 FINDINGS: Lower chest: No acute abnormality. Hepatobiliary: Hepatic steatosis. Contracted gallbladder without calcified stone. No focal hepatic abnormality. Pancreas: Unremarkable. No pancreatic ductal dilatation or surrounding inflammatory changes. Spleen: Normal in size without focal abnormality. Adrenals/Urinary Tract: Adrenal glands are unremarkable. Kidneys are normal, without renal calculi, focal lesion, or hydronephrosis. Bladder is unremarkable. Stomach/Bowel: Stomach is within normal limits. Appendix appears normal. No evidence of bowel wall thickening, distention, or inflammatory changes. Vascular/Lymphatic: No significant vascular findings are present. No enlarged abdominal or pelvic lymph  nodes. Reproductive: Status post hysterectomy. No adnexal masses. Other: No abdominal wall hernia or abnormality. No abdominopelvic ascites. Fat containing umbilical hernia, small. Musculoskeletal: No acute or significant osseous findings. IMPRESSION: 1. No CT evidence for acute intra-abdominal or pelvic abnormality. 2. Hepatic steatosis Electronically Signed   By: Donavan Foil M.D.   On: 03/30/2021 19:24   US PELVIC COMPLETE W TRANSVAGINAL AND TORSION R/O  Result Date: 03/31/2021 CLINICAL DATA:  Right adnexal tenderness EXAM: TRANSABDOMINAL AND TRANSVAGINAL ULTRASOUND OF PELVIS DOPPLER ULTRASOUND OF OVARIES TECHNIQUE: Both transabdominal and transvaginal ultrasound examinations of the pelvis were performed. Transabdominal technique was performed for global imaging of the pelvis including uterus, ovaries, adnexal regions, and pelvic cul-de-sac. It was necessary to proceed with endovaginal exam following the transabdominal exam to visualize the right ovary. Color and duplex Doppler ultrasound was utilized to evaluate blood flow to the ovaries. COMPARISON:  Abdominal CT from earlier today. Pelvic ultrasound 04/12/2018. FINDINGS: Uterus Surgically absent Right ovary Measurements: 13 x 26 x 17 mm = volume: 2.8 mL. Normal appearance/no adnexal mass. Left ovary Measurements: 23 x 25 x 21 mm = volume: 6.4 mL. Normal appearance/no adnexal mass. Low resistance arterial waveforms are demonstrated bilaterally. Venous waveforms are not clearly captured. No morphologic changes of torsion. Other findings No abnormal free fluid. IMPRESSION: 1. Normal ultrasound of the ovaries. 2. Hysterectomy. Electronically Signed   By: Jorje Guild M.D.   On: 03/31/2021 07:04       Domenic Moras, PA-C 03/31/21 0802    Gareth Morgan, MD 03/31/21 1011

## 2021-04-15 ENCOUNTER — Other Ambulatory Visit: Payer: Self-pay | Admitting: Obstetrics and Gynecology

## 2021-04-15 DIAGNOSIS — N939 Abnormal uterine and vaginal bleeding, unspecified: Secondary | ICD-10-CM

## 2021-05-15 ENCOUNTER — Ambulatory Visit (INDEPENDENT_AMBULATORY_CARE_PROVIDER_SITE_OTHER): Payer: Medicare HMO | Admitting: Obstetrics and Gynecology

## 2021-05-15 ENCOUNTER — Other Ambulatory Visit: Payer: Self-pay

## 2021-05-15 ENCOUNTER — Encounter: Payer: Self-pay | Admitting: Obstetrics and Gynecology

## 2021-05-15 VITALS — BP 114/74 | HR 106 | Wt 230.0 lb

## 2021-05-15 DIAGNOSIS — R278 Other lack of coordination: Secondary | ICD-10-CM

## 2021-05-15 DIAGNOSIS — R350 Frequency of micturition: Secondary | ICD-10-CM | POA: Diagnosis not present

## 2021-05-15 LAB — POCT URINALYSIS DIPSTICK
Appearance: NORMAL
Bilirubin, UA: NEGATIVE
Blood, UA: NEGATIVE
Glucose, UA: NEGATIVE
Ketones, UA: NEGATIVE
Leukocytes, UA: NEGATIVE
Nitrite, UA: NEGATIVE
Protein, UA: NEGATIVE
Spec Grav, UA: >=1.03 — AB (ref 1.010–1.025)
Urobilinogen, UA: 0.2 E.U./dL
pH, UA: 6 (ref 5.0–8.0)
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
## 2021-06-15 ENCOUNTER — Ambulatory Visit: Payer: Medicare HMO | Admitting: Obstetrics and Gynecology

## 2021-06-15 NOTE — Progress Notes (Deleted)
[facility] Return Visit  SUBJECTIVE  History of Present Illness: Lynn Molina is a 50 y.o. female seen in follow-up for bladder urgency and frequency. She filled out a 3 day bladder diary to qualify her symptoms. Referral also placed to pelvic floor PT but she has not made an appointment yet.     Past Medical History: Patient  has a past medical history of Anemia, Anxiety, Arthritis, Asthma, Depression, Environmental allergies, Environmental and seasonal allergies (09/11/2017), Eye pain, right, GERD (gastroesophageal reflux disease), Head injury, Headache, Morbid obesity with BMI of 40.0-44.9, adult ([place]) (09/11/2017), Nerve damage, Speech disorder, Speech impediment, Stuttering (09/11/2017), and SVD (spontaneous vaginal delivery).   Past Surgical History: She  has a past surgical history that includes Tubal ligation; Dental surgery; Wisdom tooth extraction; Dilatation & curettage/hysteroscopy with myosure (N/A, 01/21/2016); and Vaginal hysterectomy (06/22/2018).   Medications: She has a current medication list which includes the following prescription(s): albuterol, famotidine, loratadine, meclizine, norethindrone, pregabalin, sertraline, and symbicort.   Allergies: Patient is allergic to tessalon [benzonatate], ibuprofen, and zyrtec [cetirizine hcl].   Social History: Patient  reports that she has never smoked. She has never used smokeless tobacco. She reports that she does not currently use alcohol. She reports that she does not use drugs.      OBJECTIVE     Physical Exam: There were no vitals filed for this visit. Gen: No apparent distress, A&O x 3.  Detailed Urogynecologic Evaluation:  Deferred. Prior exam showed:  No flowsheet data found.     ASSESSMENT AND PLAN    Lynn Molina is a 50 y.o. with:  No diagnosis found.

## 2022-02-15 ENCOUNTER — Ambulatory Visit
Admission: EM | Admit: 2022-02-15 | Discharge: 2022-02-15 | Disposition: A | Discharge status: Home / Self Care | Payer: Medicare HMO | Attending: Physician Assistant | Admitting: Physician Assistant

## 2022-02-15 DIAGNOSIS — T7840XA Allergy, unspecified, initial encounter: Secondary | ICD-10-CM | POA: Diagnosis not present

## 2022-02-15 MED ORDER — PREDNISONE 20 MG PO TABS: 40.0000 mg | ORAL_TABLET | Freq: Every day | ORAL | 0 refills | Status: AC

## 2022-02-15 MED ORDER — NYSTATIN 100000 UNIT/ML MT SUSP: 5.0000 mL | Freq: Three times a day (TID) | OROMUCOSAL | 0 refills | Status: AC

## 2022-02-15 MED ORDER — METHYLPREDNISOLONE ACETATE 40 MG/ML IJ SUSP
40.0000 mg | Freq: Once | INTRAMUSCULAR | Status: AC
  Administered 2022-02-15: 40 mg via INTRAMUSCULAR

## 2022-02-15 NOTE — ED Triage Notes (Signed)
Patient presents to Urgent Care with complaints of throat swelling since yesterday this is a chronic issue for 6 months pt is requesting steroid shot. Patient reports 5/10 pain and recent exposure to wool pt is being followed by allergist.

## 2022-02-16 NOTE — ED Provider Notes (Signed)
[facility]    CSN: [id number] Arrival date & time: 02/15/22  1905      History   Chief Complaint Chief Complaint  Patient presents with   Allergic Reaction    HPI Lynn Molina is a 51 y.o. female.   Patient here today for evaluation of mild throat swelling and facial swelling that started yesterday.  She reports that this is a chronic issue for her for the last 6 months and if she is requesting a steroid injection as this typically is helpful in controlling allergic reactions.  She denies any trouble swallowing.  She denies any trouble breathing or shortness of breath.  She notes she did have hives a few days ago but this is resolved.  She has been taking oral allergy medications which have been somewhat helpful.  The history is provided by the patient.  Allergic Reaction Presenting symptoms: no difficulty swallowing     Past Medical History:  Diagnosis Date   Anemia    hx   Anxiety    Arthritis    hips, lower  back, knees - no meds   Asthma    Depression    Environmental allergies    tx with alavert and occasional allegra   Environmental and seasonal allergies 09/11/2017   Eye pain, right    06/2018-resolved, no longer a problem.   GERD (gastroesophageal reflux disease)    Head injury    Hammer fell on head - tx w lyrica   Headache    tx with lyrica   Morbid obesity with BMI of 40.0-44.9, adult ([place]) 09/11/2017   Nerve damage    Head   Speech disorder    patient uses "speech easy" in her right ear for speech disorder, 06/2018-pt states she lost "speech easy"    Speech impediment    r/t head injury-uses "Speech easy" but did not have it at PAT appt.  Will bring it on DOS.   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   SVD (spontaneous vaginal delivery)    x 2    Patient Active Problem List   Diagnosis Date Noted   Post-operative state 06/22/2018   Stuttering 09/11/2017   Asthma 09/11/2017   Environmental and seasonal  allergies 09/11/2017   Morbid obesity with BMI of 40.0-44.9, adult ([place]) 09/11/2017   Confusion 11/04/2016   History of traumatic brain injury 11/04/2016   Headaches due to old head injury 05/15/2013    Past Surgical History:  Procedure Laterality Date   DENTAL SURGERY     tooth ext with general anesthesia   DILATATION & CURETTAGE/HYSTEROSCOPY WITH MYOSURE N/A 01/21/2016   Procedure: DILATATION & CURETTAGE/HYSTEROSCOPY WITH MYOSURE;  Surgeon: Aloha Gell, MD;  Location: [facility];  Service: Gynecology;  Laterality: N/A;   TUBAL LIGATION     VAGINAL HYSTERECTOMY  06/22/2018   Procedure: HYSTERECTOMY VAGINAL;  Surgeon: Chancy Milroy, MD;  Location: [facility];  Service: Gynecology;;   WISDOM TOOTH EXTRACTION      OB History     Gravida  2   Para  2   Term  2   Preterm  0   AB  0   Living  2      SAB  0   IAB  0   Ectopic  0   Multiple  0   Live Births  2            Home Medications    Prior to Admission medications  Medication Sig Start Date End Date Taking? Authorizing Provider  magic mouthwash (nystatin, hydrocortisone, diphenhydrAMINE) suspension Swish and spit 5 mLs 3 (three) times daily for 7 days. 02/15/22 02/22/22 Yes Francene Finders, PA-C  predniSONE (DELTASONE) 20 MG tablet Take 2 tablets (40 mg total) by mouth daily with breakfast for 5 days. 02/15/22 02/20/22 Yes Francene Finders, PA-C  albuterol (PROVENTIL HFA;VENTOLIN HFA) 108 (90 Base) MCG/ACT inhaler Inhale 2 puffs into the lungs every 6 (six) hours as needed for wheezing or shortness of breath. 09/07/17   Mack Hook, MD  famotidine (PEPCID) 20 MG tablet Take 20 mg by mouth daily as needed for heartburn or indigestion.    [provider]  loratadine (CLARITIN) 10 MG tablet Take 10 mg by mouth daily.    [provider]  meclizine (ANTIVERT) 12.5 MG tablet Take 12.5 mg by mouth 3 (three) times daily as needed for dizziness.    [provider]  norethindrone  (AYGESTIN) 5 MG tablet TAKE 2 TABLETS BY MOUTH DAILY 04/06/19   Chancy Milroy, MD  pregabalin (LYRICA) 150 MG capsule Take 1 capsule (150 mg total) by mouth 3 (three) times daily. 09/07/17   Mack Hook, MD  sertraline (ZOLOFT) 50 MG tablet Take 50 mg by mouth daily.  04/28/18   [provider]  SYMBICORT 80-4.5 MCG/ACT inhaler 2 puffs twice daily Patient taking differently: Inhale 2 puffs into the lungs 2 (two) times daily. 2 puffs twice daily 09/07/17   Mack Hook, MD    Family History Family History  Problem Relation Age of Onset   Breast cancer Mother    Heart Problems Father    Leukemia Brother    Heart Problems Daughter    Prostate cancer Paternal Grandfather    Cancer Brother    Diabetes Paternal Grandmother    High blood pressure Paternal Grandmother    Diabetes Maternal Grandmother    High blood pressure Maternal Grandmother     Social History Social History   Tobacco Use   Smoking status: Never   Smokeless tobacco: Never  Vaping Use   Vaping Use: Never used  Substance Use Topics   Alcohol use: Not Currently   Drug use: No     Allergies   Tessalon [benzonatate], Ibuprofen, and Zyrtec [cetirizine hcl]   Review of Systems Review of Systems  Constitutional:  Negative for chills and fever.  HENT:  Positive for congestion and facial swelling. Negative for trouble swallowing.   Eyes:  Negative for discharge and redness.  Respiratory:  Negative for shortness of breath.   Gastrointestinal:  Negative for abdominal pain, nausea and vomiting.     Physical Exam Triage Vital Signs ED Triage Vitals  Enc Vitals Group     BP 02/15/22 1956 128/71     Pulse Rate 02/15/22 1956 (!) 107     Resp 02/15/22 1956 19     Temp 02/15/22 1956 98 F (36.7 C)     Temp src --      SpO2 02/15/22 1956 98 %     Weight --      Height --      Head Circumference --      Peak Flow --      Pain Score 02/15/22 1948 0     Pain Loc --      Pain Edu? --       Excl. in [place]? --    No data found.  Updated Vital Signs BP 128/71   Pulse Marland Kitchen)  107   Temp 98 F (36.7 C)   Resp 19   LMP 06/22/2018   SpO2 98%   Physical Exam Vitals and nursing note reviewed.  Constitutional:      General: She is not in acute distress.    Appearance: Normal appearance. She is not ill-appearing.     Comments: Patient is nonverbal due to prior TBI  HENT:     Head: Normocephalic and atraumatic.     Comments: Minimal facial swelling noted    Nose: Congestion (mild) present.  Eyes:     Conjunctiva/sclera: Conjunctivae normal.  Cardiovascular:     Rate and Rhythm: Normal rate.  Pulmonary:     Effort: Pulmonary effort is normal.  Neurological:     Mental Status: She is alert.  Psychiatric:        Mood and Affect: Mood normal.        Behavior: Behavior normal.        Thought Content: Thought content normal.      UC Treatments / Results  Labs (all labs ordered are listed, but only abnormal results are displayed) Labs Reviewed - No data to display  EKG   Radiology No results found.  Procedures Procedures (including critical care time)  Medications Ordered in UC Medications  methylPREDNISolone acetate (DEPO-MEDROL) injection 40 mg (40 mg Intramuscular Given 02/15/22 2006)    Initial Impression / Assessment and Plan / UC Course  I have reviewed the triage vital signs and the nursing notes.  Pertinent labs & imaging results that were available during my care of the patient were reviewed by me and considered in my medical decision making (see chart for details).    Steroid injection administered in office and prednisone prescribed.  Magic mouthwash also prescribed as she reports that she has had some pain in her mouth and she requested same.  Encouraged follow-up with any further concerns.  She will continue to follow-up with her allergist.  Final Clinical Impressions(s) / UC Diagnoses   Final diagnoses:  Allergic reaction, initial encounter    Discharge Instructions   None    ED Prescriptions     Medication Sig Dispense Auth. Provider   predniSONE (DELTASONE) 20 MG tablet Take 2 tablets (40 mg total) by mouth daily with breakfast for 5 days. 10 tablet Ewell Poe F, PA-C   magic mouthwash (nystatin, hydrocortisone, diphenhydrAMINE) suspension Swish and spit 5 mLs 3 (three) times daily for 7 days. 540 mL Francene Finders, PA-C      PDMP not reviewed this encounter.   Francene Finders, PA-C 02/16/22 [phone]

## 2022-07-19 ENCOUNTER — Emergency Department (HOSPITAL_COMMUNITY)
Admission: EM | Admit: 2022-07-19 | Discharge: 2022-07-19 | Disposition: A | Discharge status: Home / Self Care | Payer: Medicare HMO | Attending: Emergency Medicine | Admitting: Emergency Medicine

## 2022-07-19 ENCOUNTER — Other Ambulatory Visit: Payer: Self-pay

## 2022-07-19 ENCOUNTER — Encounter (HOSPITAL_COMMUNITY): Payer: Self-pay

## 2022-07-19 ENCOUNTER — Emergency Department (HOSPITAL_COMMUNITY): Payer: Medicare HMO

## 2022-07-19 DIAGNOSIS — R0602 Shortness of breath: Secondary | ICD-10-CM | POA: Diagnosis not present

## 2022-07-19 DIAGNOSIS — Z1152 Encounter for screening for COVID-19: Secondary | ICD-10-CM | POA: Insufficient documentation

## 2022-07-19 DIAGNOSIS — M7918 Myalgia, other site: Secondary | ICD-10-CM | POA: Diagnosis present

## 2022-07-19 DIAGNOSIS — B349 Viral infection, unspecified: Secondary | ICD-10-CM | POA: Insufficient documentation

## 2022-07-19 LAB — RESP PANEL BY RT-PCR (RSV, FLU A&B, COVID)  RVPGX2
Influenza A by PCR: NEGATIVE
Influenza B by PCR: NEGATIVE
Resp Syncytial Virus by PCR: NEGATIVE
SARS Coronavirus 2 by RT PCR: NEGATIVE

## 2022-07-19 LAB — BASIC METABOLIC PANEL
Anion gap: 11 (ref 5–15)
BUN: 7 mg/dL (ref 6–20)
CO2: 29 mmol/L (ref 22–32)
Calcium: 9.3 mg/dL (ref 8.9–10.3)
Chloride: 100 mmol/L (ref 98–111)
Creatinine, Ser: 0.79 mg/dL (ref 0.44–1.00)
GFR, Estimated: >60 mL/min (ref >60)
Glucose, Bld: 112 mg/dL — ABNORMAL HIGH (ref 70–99)
Potassium: 3.8 mmol/L (ref 3.5–5.1)
Sodium: 140 mmol/L (ref 135–145)

## 2022-07-19 LAB — CBC
HCT: 39.2 % (ref 36.0–46.0)
Hemoglobin: 11.8 g/dL — ABNORMAL LOW (ref 12.0–15.0)
MCH: 23.6 pg — ABNORMAL LOW (ref 26.0–34.0)
MCHC: 30.1 g/dL (ref 30.0–36.0)
MCV: 78.2 fL — ABNORMAL LOW (ref 80.0–100.0)
Platelets: 371 10*3/uL (ref 150–400)
RBC: 5.01 MIL/uL (ref 3.87–5.11)
RDW: 14.8 % (ref 11.5–15.5)
WBC: 10.5 10*3/uL (ref 4.0–10.5)
nRBC: 0 % (ref 0.0–0.2)

## 2022-07-19 LAB — TROPONIN I (HIGH SENSITIVITY): Troponin I (High Sensitivity): 5 ng/L (ref <18)

## 2022-07-19 MED ORDER — ACETAMINOPHEN 325 MG PO TABS
650.0000 mg | ORAL_TABLET | Freq: Once | ORAL | Status: AC
  Administered 2022-07-19: 650 mg via ORAL
  Filled 2022-07-19: qty 2

## 2022-07-19 NOTE — ED Provider Notes (Signed)
[facility] Provider Note   CSN: [id number] Arrival date & time: 07/19/22  1031     History  Chief Complaint  Patient presents with   Generalized Body Aches    Lynn Molina is a 52 y.o. female.  52 year old female with dense with several days of URI symptoms.  Notes cough congestion.  No vomiting or diarrhea.  No fever or chills.  Has used home COVID test which were negative.  Denies any urinary symptoms.  Does note some chest tightness without anginal or CHF type symptoms.  Using over-the-counter medication without relief       Home Medications Prior to Admission medications   Medication Sig Start Date End Date Taking? Authorizing Provider  albuterol (PROVENTIL HFA;VENTOLIN HFA) 108 (90 Base) MCG/ACT inhaler Inhale 2 puffs into the lungs every 6 (six) hours as needed for wheezing or shortness of breath. 09/07/17   Mack Hook, MD  famotidine (PEPCID) 20 MG tablet Take 20 mg by mouth daily as needed for heartburn or indigestion.    [provider]  loratadine (CLARITIN) 10 MG tablet Take 10 mg by mouth daily.    [provider]  meclizine (ANTIVERT) 12.5 MG tablet Take 12.5 mg by mouth 3 (three) times daily as needed for dizziness.    [provider]  norethindrone (AYGESTIN) 5 MG tablet TAKE 2 TABLETS BY MOUTH DAILY 04/06/19   Chancy Milroy, MD  pregabalin (LYRICA) 150 MG capsule Take 1 capsule (150 mg total) by mouth 3 (three) times daily. 09/07/17   Mack Hook, MD  sertraline (ZOLOFT) 50 MG tablet Take 50 mg by mouth daily.  04/28/18   [provider]  SYMBICORT 80-4.5 MCG/ACT inhaler 2 puffs twice daily Patient taking differently: Inhale 2 puffs into the lungs 2 (two) times daily. 2 puffs twice daily 09/07/17   Mack Hook, MD      Allergies    Tessalon [benzonatate], Ibuprofen, and Zyrtec [cetirizine hcl]    Review of Systems   Review of Systems  All other systems reviewed and  are negative.   Physical Exam Updated Vital Signs BP (!) 165/101   Pulse (!) 101   Temp 98.7 F (37.1 C) (Oral)   Resp 19   LMP 06/22/2018   SpO2 100%  Physical Exam Vitals and nursing note reviewed.  Constitutional:      General: She is not in acute distress.    Appearance: Normal appearance. She is well-developed. She is not toxic-appearing.  HENT:     Head: Normocephalic and atraumatic.  Eyes:     General: Lids are normal.     Conjunctiva/sclera: Conjunctivae normal.     Pupils: Pupils are equal, round, and reactive to light.  Neck:     Thyroid: No thyroid mass.     Trachea: No tracheal deviation.  Cardiovascular:     Rate and Rhythm: Normal rate and regular rhythm.     Heart sounds: Normal heart sounds. No murmur heard.    No gallop.  Pulmonary:     Effort: Pulmonary effort is normal. No respiratory distress.     Breath sounds: Normal breath sounds. No stridor. No decreased breath sounds, wheezing, rhonchi or rales.  Abdominal:     General: There is no distension.     Palpations: Abdomen is soft.     Tenderness: There is no abdominal tenderness. There is no rebound.  Musculoskeletal:        General: No tenderness. Normal range of motion.  Cervical back: Normal range of motion and neck supple.  Skin:    General: Skin is warm and dry.     Findings: No abrasion or rash.  Neurological:     Mental Status: She is alert and oriented to person, place, and time. Mental status is at baseline.     GCS: GCS eye subscore is 4. GCS verbal subscore is 5. GCS motor subscore is 6.     Cranial Nerves: No cranial nerve deficit.     Sensory: No sensory deficit.     Motor: Motor function is intact.  Psychiatric:        Attention and Perception: Attention normal.        Speech: Speech normal.        Behavior: Behavior normal.     ED Results / Procedures / Treatments   Labs (all labs ordered are listed, but only abnormal results are displayed) Labs Reviewed  BASIC  METABOLIC PANEL - Abnormal; Notable for the following components:      Result Value   Glucose, Bld 112 (*)    All other components within normal limits  CBC - Abnormal; Notable for the following components:   Hemoglobin 11.8 (*)    MCV 78.2 (*)    MCH 23.6 (*)    All other components within normal limits  RESP PANEL BY RT-PCR (RSV, FLU A&B, COVID)  RVPGX2  TROPONIN I (HIGH SENSITIVITY)  TROPONIN I (HIGH SENSITIVITY)    EKG EKG Interpretation  Date/Time:  Monday July 19 2022 10:44:54 EST Ventricular Rate:  105 PR Interval:  146 QRS Duration: 91 QT Interval:  340 QTC Calculation: 450 R Axis:   218 Text Interpretation: Sinus tachycardia Multiple premature complexes, vent & supraven Right axis deviation Borderline T abnormalities, inferior leads No significant change since last tracing Confirmed by Lacretia Leigh ([id number]) on 07/19/2022 3:37:06 PM  Radiology DG Chest 2 View  Result Date: 07/19/2022 CLINICAL DATA:  Shortness of breath and chest pain. EXAM: CHEST - 2 VIEW COMPARISON:  October 31, 2016 FINDINGS: The heart size and mediastinal contours are within normal limits. Both lungs are clear. The visualized skeletal structures are unremarkable. IMPRESSION: No active cardiopulmonary disease. Electronically Signed   By: Abelardo Diesel M.D.   On: 07/19/2022 10:56    Procedures Procedures    Medications Ordered in ED Medications  acetaminophen (TYLENOL) tablet 650 mg (650 mg Oral Given 07/19/22 1042)    ED Course/ Medical Decision Making/ A&P                             Medical Decision Making  Patient's COVID, flu, RSV all negative here.  Chest x-ray per interpretation shows no acute findings.  EKG per interpretation shows no acute ACS symptoms.  Low suspicion for ACS or PE.  Suspect viral illness.  No evidence of pneumonia.  Will discharge home.        Final Clinical Impression(s) / ED Diagnoses Final diagnoses:  None    Rx / DC Orders ED Discharge Orders      None         Lacretia Leigh, MD 07/19/22 1752

## 2022-07-19 NOTE — ED Provider Triage Note (Signed)
Emergency Medicine Provider Triage Evaluation Note  Lynn Molina , a 52 y.o. female  was evaluated in triage.  Pt complains of productive cough, chest pain, chills, and fatiguex3 days. Took COVID test at home that is negative.   Review of Systems  Positive: Cough, chest pain Negative: fever  Physical Exam  Pulse (!) 101   Temp 98.7 F (37.1 C) (Oral)   Resp 19   LMP 06/22/2018   SpO2 100%  Gen:   Awake, no distress   Resp:  Normal effort  MSK:   Moves extremities without difficulty  Other:  +coarse RLL sounds  Medical Decision Making  Medically screening exam initiated at 10:40 AM.  Appropriate orders placed.  Lynn Molina was informed that the remainder of the evaluation will be completed by another provider, this initial triage assessment does not replace that evaluation, and the importance of remaining in the ED until their evaluation is complete.    Osvaldo Shipper, [place] 07/19/22 1041

## 2022-07-19 NOTE — ED Triage Notes (Addendum)
Patient has had generalized body aches for days. Covid negative at home. Chest pain with no radiation.

## 2022-09-02 ENCOUNTER — Other Ambulatory Visit: Payer: Self-pay

## 2022-09-02 ENCOUNTER — Emergency Department (HOSPITAL_COMMUNITY): Payer: Medicare HMO

## 2022-09-02 ENCOUNTER — Emergency Department (HOSPITAL_COMMUNITY)
Admission: EM | Admit: 2022-09-02 | Discharge: 2022-09-02 | Disposition: A | Discharge status: Home / Self Care | Payer: Medicare HMO | Attending: Emergency Medicine | Admitting: Emergency Medicine

## 2022-09-02 DIAGNOSIS — S0990XA Unspecified injury of head, initial encounter: Secondary | ICD-10-CM | POA: Diagnosis not present

## 2022-09-02 DIAGNOSIS — J45909 Unspecified asthma, uncomplicated: Secondary | ICD-10-CM | POA: Insufficient documentation

## 2022-09-02 DIAGNOSIS — Y92512 Supermarket, store or market as the place of occurrence of the external cause: Secondary | ICD-10-CM | POA: Diagnosis not present

## 2022-09-02 DIAGNOSIS — S161XXA Strain of muscle, fascia and tendon at neck level, initial encounter: Secondary | ICD-10-CM | POA: Insufficient documentation

## 2022-09-02 DIAGNOSIS — M542 Cervicalgia: Secondary | ICD-10-CM | POA: Diagnosis present

## 2022-09-02 MED ORDER — METHOCARBAMOL 500 MG PO TABS: 500.0000 mg | ORAL_TABLET | Freq: Three times a day (TID) | ORAL | 0 refills | Status: DC | PRN

## 2022-09-02 MED ORDER — MORPHINE SULFATE (PF) 4 MG/ML IV SOLN
4.0000 mg | Freq: Once | INTRAVENOUS | Status: AC
  Administered 2022-09-02: 4 mg via INTRAVENOUS
  Filled 2022-09-02: qty 1

## 2022-09-02 MED ORDER — ACETAMINOPHEN 500 MG PO TABS
1000.0000 mg | ORAL_TABLET | Freq: Once | ORAL | Status: DC
  Filled 2022-09-02: qty 2

## 2022-09-02 MED ORDER — DEXAMETHASONE SODIUM PHOSPHATE 10 MG/ML IJ SOLN
10.0000 mg | Freq: Once | INTRAMUSCULAR | Status: AC
  Administered 2022-09-02: 10 mg via INTRAVENOUS
  Filled 2022-09-02: qty 1

## 2022-09-02 MED ORDER — HYDROCODONE-ACETAMINOPHEN 5-325 MG PO TABS: 1.0000 | ORAL_TABLET | ORAL | 0 refills | Status: DC | PRN

## 2022-09-02 MED ORDER — ONDANSETRON HCL 4 MG/2ML IJ SOLN
4.0000 mg | Freq: Once | INTRAMUSCULAR | Status: AC
  Administered 2022-09-02: 4 mg via INTRAVENOUS
  Filled 2022-09-02: qty 2
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
## 2022-09-07 ENCOUNTER — Ambulatory Visit
Admission: EM | Admit: 2022-09-07 | Discharge: 2022-09-07 | Disposition: A | Discharge status: Home / Self Care | Payer: Medicare HMO

## 2022-09-07 ENCOUNTER — Encounter: Payer: Self-pay | Admitting: Emergency Medicine

## 2022-09-07 DIAGNOSIS — M542 Cervicalgia: Secondary | ICD-10-CM

## 2022-09-07 DIAGNOSIS — R52 Pain, unspecified: Secondary | ICD-10-CM
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
## 2022-09-07 NOTE — Discharge Instructions (Signed)
Go to [facility] urgent care today for further evaluation and management.  They close at 8 so please go as soon as possible.

## 2022-11-18 ENCOUNTER — Ambulatory Visit
Admission: RE | Admit: 2022-11-18 | Discharge: 2022-11-18 | Disposition: A | Discharge status: Home / Self Care | Payer: Medicare HMO | Source: Ambulatory Visit | Attending: Adult Medicine | Admitting: Adult Medicine

## 2022-11-18 ENCOUNTER — Other Ambulatory Visit: Payer: Self-pay | Admitting: Adult Medicine

## 2022-11-18 DIAGNOSIS — Z1231 Encounter for screening mammogram for malignant neoplasm of breast: Secondary | ICD-10-CM

## 2023-04-01 ENCOUNTER — Emergency Department (HOSPITAL_COMMUNITY): Payer: Medicare HMO

## 2023-04-01 ENCOUNTER — Emergency Department (HOSPITAL_COMMUNITY)
Admission: EM | Admit: 2023-04-01 | Discharge: 2023-04-01 | Disposition: A | Discharge status: Home / Self Care | Payer: Medicare HMO | Attending: Emergency Medicine | Admitting: Emergency Medicine

## 2023-04-01 ENCOUNTER — Encounter (HOSPITAL_COMMUNITY): Payer: Self-pay

## 2023-04-01 DIAGNOSIS — S161XXA Strain of muscle, fascia and tendon at neck level, initial encounter: Secondary | ICD-10-CM | POA: Insufficient documentation

## 2023-04-01 DIAGNOSIS — Y92410 Unspecified street and highway as the place of occurrence of the external cause: Secondary | ICD-10-CM | POA: Insufficient documentation

## 2023-04-01 DIAGNOSIS — S199XXA Unspecified injury of neck, initial encounter: Secondary | ICD-10-CM | POA: Diagnosis present

## 2023-04-01 DIAGNOSIS — M545 Low back pain, unspecified: Secondary | ICD-10-CM | POA: Diagnosis not present

## 2023-04-01 MED ORDER — ACETAMINOPHEN 500 MG PO TABS
1000.0000 mg | ORAL_TABLET | Freq: Once | ORAL | Status: AC
  Administered 2023-04-01: 1000 mg via ORAL
  Filled 2023-04-01: qty 2
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
## 2023-04-01 NOTE — Discharge Instructions (Addendum)
Your imaging that was done today was normal.  This means that you do not have a fracture.  Tomorrow, you can expect to feel more sore than you are today.  You can take Tylenol and ibuprofen.  Follow-up with your primary care doctor.  You will likely be sore for about 1 week.

## 2023-04-02 ENCOUNTER — Ambulatory Visit (HOSPITAL_COMMUNITY)
Admission: EM | Admit: 2023-04-02 | Discharge: 2023-04-02 | Disposition: A | Discharge status: Home / Self Care | Payer: Medicare HMO | Attending: Emergency Medicine | Admitting: Emergency Medicine

## 2023-04-02 ENCOUNTER — Encounter (HOSPITAL_COMMUNITY): Payer: Self-pay | Admitting: Emergency Medicine

## 2023-04-02 DIAGNOSIS — M7918 Myalgia, other site: Secondary | ICD-10-CM | POA: Diagnosis not present

## 2023-04-02 DIAGNOSIS — S39012D Strain of muscle, fascia and tendon of lower back, subsequent encounter: Secondary | ICD-10-CM | POA: Diagnosis not present

## 2023-04-02 DIAGNOSIS — S161XXD Strain of muscle, fascia and tendon at neck level, subsequent encounter: Secondary | ICD-10-CM | POA: Diagnosis not present

## 2023-04-02 MED ORDER — TRAMADOL HCL 50 MG PO TABS: 50.0000 mg | ORAL_TABLET | Freq: Four times a day (QID) | ORAL | 0 refills | Status: AC | PRN

## 2023-04-02 MED ORDER — PREDNISONE 10 MG PO TABS: 10.0000 mg | ORAL_TABLET | Freq: Every day | ORAL | 0 refills | Status: AC

## 2023-04-02 MED ORDER — METHOCARBAMOL 500 MG PO TABS: 500.0000 mg | ORAL_TABLET | Freq: Three times a day (TID) | ORAL | 0 refills | Status: AC | PRN

## 2023-04-02 MED ORDER — DEXAMETHASONE SODIUM PHOSPHATE 10 MG/ML IJ SOLN
10.0000 mg | Freq: Once | INTRAMUSCULAR | Status: AC
  Administered 2023-04-02: 10 mg via INTRAMUSCULAR

## 2023-04-02 MED ORDER — DEXAMETHASONE SODIUM PHOSPHATE 10 MG/ML IJ SOLN
INTRAMUSCULAR | Status: AC
  Filled 2023-04-02: qty 1

## 2023-04-02 MED ORDER — HYDROCODONE-ACETAMINOPHEN 5-325 MG PO TABS
1.0000 | ORAL_TABLET | Freq: Once | ORAL | Status: AC
  Administered 2023-04-02: 1 via ORAL

## 2023-04-02 MED ORDER — HYDROCODONE-ACETAMINOPHEN 5-325 MG PO TABS
ORAL_TABLET | ORAL | Status: AC
  Filled 2023-04-02: qty 1
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
## 2023-04-02 NOTE — Discharge Instructions (Addendum)
Tramadol as needed for pain.  May add Tylenol 1000mg  with the tramadol if needed.   Start Prednisone 10mg  tomorrow in the morning for 5 days. Baclofen for muscle spasms Hot showers with gentle stretching and movement.  Ice to neck and back for at least every other hour.  Topical ointments such as bengay  Follow up with Primary Care for continued care.   Consider massage therapy

## 2023-10-26 ENCOUNTER — Other Ambulatory Visit: Payer: Self-pay | Admitting: Adult Medicine

## 2023-10-26 DIAGNOSIS — Z1231 Encounter for screening mammogram for malignant neoplasm of breast: Secondary | ICD-10-CM

## 2023-11-14 ENCOUNTER — Encounter (HOSPITAL_COMMUNITY): Payer: Self-pay

## 2023-11-21 ENCOUNTER — Ambulatory Visit
Admission: RE | Admit: 2023-11-21 | Discharge: 2023-11-21 | Disposition: A | Discharge status: Home / Self Care | Source: Ambulatory Visit | Attending: Adult Medicine | Admitting: Adult Medicine

## 2023-11-21 DIAGNOSIS — Z1231 Encounter for screening mammogram for malignant neoplasm of breast: Secondary | ICD-10-CM

## 2023-11-24 ENCOUNTER — Other Ambulatory Visit: Payer: Self-pay | Admitting: Adult Medicine

## 2023-11-24 DIAGNOSIS — N644 Mastodynia: Secondary | ICD-10-CM

## 2023-12-14 ENCOUNTER — Ambulatory Visit
Admission: RE | Admit: 2023-12-14 | Discharge: 2023-12-14 | Disposition: A | Discharge status: Home / Self Care | Source: Ambulatory Visit | Attending: Adult Medicine | Admitting: Adult Medicine

## 2023-12-14 DIAGNOSIS — N644 Mastodynia: Secondary | ICD-10-CM

## 2024-07-31 ENCOUNTER — Ambulatory Visit (HOSPITAL_COMMUNITY)
Admission: EM | Admit: 2024-07-31 | Discharge: 2024-07-31 | Disposition: A | Discharge status: Home / Self Care | Attending: Family Medicine | Admitting: Family Medicine

## 2024-07-31 DIAGNOSIS — J020 Streptococcal pharyngitis: Secondary | ICD-10-CM | POA: Insufficient documentation

## 2024-07-31 DIAGNOSIS — J029 Acute pharyngitis, unspecified: Secondary | ICD-10-CM | POA: Diagnosis not present

## 2024-07-31 DIAGNOSIS — R509 Fever, unspecified: Secondary | ICD-10-CM | POA: Diagnosis not present

## 2024-07-31 LAB — POCT INFLUENZA A/B
Influenza A, POC: NEGATIVE
Influenza B, POC: NEGATIVE

## 2024-07-31 LAB — POCT RAPID STREP A (OFFICE): Rapid Strep A Screen: NEGATIVE

## 2024-07-31 MED ORDER — ALBUTEROL SULFATE HFA 108 (90 BASE) MCG/ACT IN AERS: 2.0000 | INHALATION_SPRAY | Freq: Four times a day (QID) | RESPIRATORY_TRACT | 1 refills | Status: AC | PRN

## 2024-07-31 MED ORDER — EPINEPHRINE 0.3 MG/0.3ML IJ SOAJ: 0.3000 mg | INTRAMUSCULAR | 0 refills | Status: AC | PRN

## 2024-07-31 MED ORDER — AMOXICILLIN 875 MG PO TABS: 875.0000 mg | ORAL_TABLET | Freq: Two times a day (BID) | ORAL | 0 refills | Status: AC

## 2024-07-31 NOTE — Discharge Instructions (Addendum)
 You were seen today for upper respiratory symptoms.  Your flu swab was negative, as was your strep test.  However, given you symptoms I am treating you for possible strep with amoxicillin  twice/day x 7 days.  We will send the test for culture and you will be notified of results in several days.   Please continue tylenol  for sore throat and fever, as well as warm salt water gargles.

## 2024-07-31 NOTE — ED Provider Notes (Signed)
 " [facility]    CSN: [id number] Arrival date & time: 07/31/24  1009      History   Chief Complaint Chief Complaint  Patient presents with   Sore Throat    HPI Lynn Molina is a 54 y.o. female.    Sore Throat  Patient is here for headache, sore throat, swollen glands for several days.  She reports a fever up to 102.8.  Her grandchild was over, and was sick with fever n/v this week.  She is taking tylenol  for her symptoms.   She is also requesting a refill of her albuterol  and epi-pen.       Past Medical History:  Diagnosis Date   Anemia    hx   Anxiety    Arthritis    hips, lower  back, knees - no meds   Asthma    Depression    Environmental allergies    tx with alavert and occasional allegra    Environmental and seasonal allergies 09/11/2017   Eye pain, right    06/2018-resolved, no longer a problem.   GERD (gastroesophageal reflux disease)    Head injury    Hammer fell on head - tx w lyrica    Headache    tx with lyrica    Morbid obesity with BMI of 40.0-44.9, adult (HCC) 09/11/2017   Nerve damage    Head   Speech disorder    patient uses speech easy in her right ear for speech disorder, 06/2018-pt states she lost speech easy    Speech impediment    r/t head injury-uses Speech easy but did not have it at PAT appt.  Will bring it on DOS.   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/15/2013    Past Surgical History:  Procedure Laterality Date   DENTAL SURGERY     tooth ext with general anesthesia   DILATATION &  CURETTAGE/HYSTEROSCOPY WITH MYOSURE N/A 01/21/2016   Procedure: DILATATION & CURETTAGE/HYSTEROSCOPY WITH MYOSURE;  Surgeon: Burnard Bowers, MD;  Location: [facility];  Service: Gynecology;  Laterality: N/A;   TUBAL LIGATION     VAGINAL HYSTERECTOMY  06/22/2018   Procedure: HYSTERECTOMY VAGINAL;  Surgeon: Lorence Ozell CROME, MD;  Location: [facility];  Service: Gynecology;;   WISDOM TOOTH EXTRACTION      OB History     Gravida  2   Para  2   Term  2   Preterm  0   AB  0   Living  2      SAB  0   IAB  0   Ectopic  0   Multiple  0   Live Births  2            Home Medications    Prior to Admission medications  Medication Sig Start Date End Date Taking? Authorizing Provider  albuterol  (PROVENTIL  HFA;VENTOLIN  HFA) 108 (90 Base) MCG/ACT inhaler Inhale 2 puffs into the lungs every 6 (six) hours as needed for wheezing or shortness of breath. 09/07/17   Adella Norris, MD  famotidine (PEPCID)  20 MG tablet Take 20 mg by mouth daily as needed for heartburn or indigestion.    [provider]  loratadine (CLARITIN) 10 MG tablet Take 10 mg by mouth daily.    [provider]  meclizine  (ANTIVERT ) 12.5 MG tablet Take 12.5 mg by mouth 3 (three) times daily as needed for dizziness.    [provider]  methocarbamol  (ROBAXIN ) 500 MG tablet Take 1 tablet (500 mg total) by mouth every 8 (eight) hours as needed for muscle spasms. 04/02/23   Blitch, Marval HERO, NP  norethindrone  (AYGESTIN ) 5 MG tablet TAKE 2 TABLETS BY MOUTH DAILY 04/06/19   Ervin, Michael L, MD  predniSONE  (DELTASONE ) 10 MG tablet Take 1 tablet (10 mg total) by mouth daily with breakfast. 04/02/23   Blitch, Marval HERO, NP  pregabalin  (LYRICA ) 150 MG capsule Take 1 capsule (150 mg total) by mouth 3 (three) times daily. 09/07/17   Adella Norris, MD  sertraline  (ZOLOFT ) 50 MG tablet Take 50 mg by mouth daily.  04/28/18   [provider]  SYMBICORT  80-4.5 MCG/ACT inhaler 2 puffs twice daily Patient  taking differently: Inhale 2 puffs into the lungs 2 (two) times daily. 2 puffs twice daily 09/07/17   Adella Norris, MD    Family History Family History  Problem Relation Age of Onset   Breast cancer Mother    Heart Problems Father    Leukemia Brother    Heart Problems Daughter    Prostate cancer Paternal Grandfather    Cancer Brother    Diabetes Paternal Grandmother    High blood pressure Paternal Grandmother    Diabetes Maternal Grandmother    High blood pressure Maternal Grandmother     Social History Social History[1]   Allergies   Tessalon  [benzonatate ], Ibuprofen , Topamax  [topiramate ], and Zyrtec [cetirizine hcl]   Review of Systems Review of Systems  Constitutional:  Positive for fever.  HENT:  Positive for sore throat.   Respiratory: Negative.    Cardiovascular: Negative.   Gastrointestinal: Negative.   Musculoskeletal: Negative.      Physical Exam Triage Vital Signs ED Triage Vitals  Encounter Vitals Group     BP 07/31/24 1033 129/83     Girls Systolic BP Percentile --      Girls Diastolic BP Percentile --      Boys Systolic BP Percentile --      Boys Diastolic BP Percentile --      Pulse Rate 07/31/24 1033 96     Resp 07/31/24 1033 18     Temp 07/31/24 1033 99.3 F (37.4 C)     Temp src --      SpO2 07/31/24 1033 95 %     Weight --      Height --      Head Circumference --      Peak Flow --      Pain Score 07/31/24 1030 7     Pain Loc --      Pain Education --      Exclude from Growth Chart --    No data found.  Updated Vital Signs BP 129/83   Pulse 96   Temp 99.3 F (37.4 C)   Resp 18   LMP 06/22/2018   SpO2 95%   Visual Acuity Right Eye Distance:   Left Eye Distance:   Bilateral Distance:    Right Eye Near:   Left Eye Near:    Bilateral Near:     Physical Exam Constitutional:  General: She is not in acute distress.    Appearance: She is well-developed and normal weight. She is not ill-appearing or toxic-appearing.   HENT:     Right Ear: Tympanic membrane normal.     Left Ear: Tympanic membrane normal.     Nose: Congestion present.     Mouth/Throat:     Mouth: Mucous membranes are moist.     Pharynx: Posterior oropharyngeal erythema present.     Tonsils: Tonsillar exudate present.  Cardiovascular:     Rate and Rhythm: Normal rate and regular rhythm.     Heart sounds: Normal heart sounds.  Pulmonary:     Effort: Pulmonary effort is normal.     Breath sounds: Normal breath sounds.  Musculoskeletal:     Cervical back: Normal range of motion and neck supple.  Lymphadenopathy:     Cervical: Cervical adenopathy present.  Neurological:     Mental Status: She is alert.      UC Treatments / Results  Labs (all labs ordered are listed, but only abnormal results are displayed) Labs Reviewed  CULTURE, GROUP A STREP [facility])  POCT RAPID STREP A (OFFICE)  POCT INFLUENZA A/B    EKG   Radiology No results found.  Procedures Procedures (including critical care time)  Medications Ordered in UC Medications - No data to display  Initial Impression / Assessment and Plan / UC Course  I have reviewed the triage vital signs and the nursing notes.  Pertinent labs & imaging results that were available during my care of the patient were reviewed by me and considered in my medical decision making (see chart for details).    Final Clinical Impressions(s) / UC Diagnoses   Final diagnoses:  Sore throat  Fever, unspecified  Strep throat     Discharge Instructions      You were seen today for upper respiratory symptoms.  Your flu swab was negative, as was your strep test.  However, given you symptoms I am treating you for possible strep with amoxicillin  twice/day x 7 days.  We will send the test for culture and you will be notified of results in several days.   Please continue tylenol  for sore throat and fever, as well as warm salt water gargles.      ED Prescriptions     Medication Sig  Dispense Auth. Provider   amoxicillin  (AMOXIL ) 875 MG tablet Take 1 tablet (875 mg total) by mouth 2 (two) times daily for 7 days. 14 tablet Darral Longs, MD      PDMP not reviewed this encounter.     [1]  Social History Tobacco Use   Smoking status: Never   Smokeless tobacco: Never  Vaping Use   Vaping status: Never Used  Substance Use Topics   Alcohol use: Not Currently   Drug use: No     Darral Longs, MD 07/31/24 1122  "

## 2024-07-31 NOTE — ED Triage Notes (Signed)
 Patient writing to communicate with this RN. Able to hear but has speech delay. States she does not need nurse, learning disability.  Pt presents with complaints of headache and sore throat x 2 days. Endorses fever. Would like to be tested for strep throat.

## 2024-08-02 LAB — CULTURE, GROUP A STREP (THRC)
